# Patient Record
Sex: Male | Born: 1975 | Race: White | Hispanic: No | Marital: Married | Smoking: Never smoker
Health system: Southern US, Community
[De-identification: ages and names within clinical notes are randomized; demographics above are authoritative.]

## PROBLEM LIST (undated history)

## (undated) DIAGNOSIS — F419 Anxiety disorder, unspecified: Secondary | ICD-10-CM

## (undated) DIAGNOSIS — R160 Hepatomegaly, not elsewhere classified: Secondary | ICD-10-CM

## (undated) DIAGNOSIS — G4730 Sleep apnea, unspecified: Secondary | ICD-10-CM

## (undated) DIAGNOSIS — K5790 Diverticulosis of intestine, part unspecified, without perforation or abscess without bleeding: Secondary | ICD-10-CM

## (undated) DIAGNOSIS — R451 Restlessness and agitation: Secondary | ICD-10-CM

## (undated) HISTORY — PX: CHOLECYSTECTOMY: SHX55

## (undated) HISTORY — PX: APPENDECTOMY: SHX54

## (undated) HISTORY — PX: KNEE ARTHROSCOPY: SUR90

## (undated) HISTORY — PX: INGUINAL HERNIA REPAIR: SUR1180

## (undated) HISTORY — PX: KNEE SURGERY: SHX244

## (undated) HISTORY — PX: OTHER SURGICAL HISTORY: SHX169

---
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
## 2009-08-18 ENCOUNTER — Ambulatory Visit: Payer: Self-pay | Admitting: Internal Medicine

## 2009-09-21 ENCOUNTER — Emergency Department: Payer: Self-pay | Admitting: Emergency Medicine

## 2009-10-25 ENCOUNTER — Ambulatory Visit: Payer: Self-pay | Admitting: Internal Medicine

## 2009-10-26 ENCOUNTER — Ambulatory Visit: Payer: Self-pay | Admitting: Family Medicine

## 2010-06-01 ENCOUNTER — Ambulatory Visit: Payer: Self-pay

## 2010-06-15 ENCOUNTER — Ambulatory Visit: Payer: Self-pay

## 2011-03-03 ENCOUNTER — Ambulatory Visit: Payer: Self-pay | Admitting: Specialist

## 2011-04-25 DIAGNOSIS — K37 Unspecified appendicitis: Secondary | ICD-10-CM

## 2011-04-25 HISTORY — DX: Unspecified appendicitis: K37

## 2012-01-11 DIAGNOSIS — M2240 Chondromalacia patellae, unspecified knee: Secondary | ICD-10-CM | POA: Insufficient documentation

## 2012-01-17 ENCOUNTER — Emergency Department: Payer: Self-pay | Admitting: Emergency Medicine

## 2012-01-18 LAB — BASIC METABOLIC PANEL
Anion Gap: 8 (ref 7–16)
Calcium, Total: 9.2 mg/dL (ref 8.5–10.1)
EGFR (Non-African Amer.): >60
Glucose: 77 mg/dL (ref 65–99)
Potassium: 3.5 mmol/L (ref 3.5–5.1)
Sodium: 139 mmol/L (ref 136–145)

## 2012-01-18 LAB — TROPONIN I
Troponin-I: <0.02 ng/mL
Troponin-I: <0.02 ng/mL

## 2012-01-18 LAB — CBC
HCT: 47.3 % (ref 40.0–52.0)
MCH: 32.1 pg (ref 26.0–34.0)
MCV: 91 fL (ref 80–100)
Platelet: 188 10*3/uL (ref 150–440)
RDW: 13.1 % (ref 11.5–14.5)

## 2012-01-18 LAB — CK TOTAL AND CKMB (NOT AT ARMC)
CK, Total: 59 U/L (ref 35–232)
CK-MB: <0.5 ng/mL — ABNORMAL LOW (ref 0.5–3.6)

## 2012-04-08 LAB — URINALYSIS, COMPLETE
Bilirubin,UR: NEGATIVE
Blood: NEGATIVE
Glucose,UR: NEGATIVE mg/dL (ref 0–75)
Leukocyte Esterase: NEGATIVE
Ph: 6 (ref 4.5–8.0)
Specific Gravity: 1.015 (ref 1.003–1.030)
Squamous Epithelial: NONE SEEN

## 2012-04-08 LAB — COMPREHENSIVE METABOLIC PANEL
Alkaline Phosphatase: 92 U/L (ref 50–136)
Anion Gap: 6 — ABNORMAL LOW (ref 7–16)
BUN: 8 mg/dL (ref 7–18)
Bilirubin,Total: 0.8 mg/dL (ref 0.2–1.0)
Calcium, Total: 9 mg/dL (ref 8.5–10.1)
Chloride: 107 mmol/L (ref 98–107)
Co2: 26 mmol/L (ref 21–32)
Creatinine: 0.88 mg/dL (ref 0.60–1.30)
EGFR (African American): >60
EGFR (Non-African Amer.): >60
Glucose: 90 mg/dL (ref 65–99)
Osmolality: 275 (ref 275–301)
SGOT(AST): 31 U/L (ref 15–37)
SGPT (ALT): 57 U/L (ref 12–78)

## 2012-04-08 LAB — CBC
HGB: 16.4 g/dL (ref 13.0–18.0)
MCH: 31.3 pg (ref 26.0–34.0)
MCV: 90 fL (ref 80–100)
Platelet: 205 10*3/uL (ref 150–440)
RBC: 5.23 10*6/uL (ref 4.40–5.90)

## 2012-04-08 LAB — LIPASE, BLOOD: Lipase: 106 U/L (ref 73–393)

## 2012-04-09 ENCOUNTER — Ambulatory Visit: Payer: Self-pay | Admitting: Surgery

## 2012-04-09 HISTORY — PX: APPENDECTOMY: SHX54

## 2012-04-10 LAB — BASIC METABOLIC PANEL
Calcium, Total: 8.7 mg/dL (ref 8.5–10.1)
Co2: 26 mmol/L (ref 21–32)
Creatinine: 1 mg/dL (ref 0.60–1.30)
EGFR (Non-African Amer.): >60
Glucose: 117 mg/dL — ABNORMAL HIGH (ref 65–99)
Osmolality: 274 (ref 275–301)
Potassium: 4.1 mmol/L (ref 3.5–5.1)
Sodium: 138 mmol/L (ref 136–145)

## 2012-04-10 LAB — CBC WITH DIFFERENTIAL/PLATELET
Basophil %: 0.2 %
Eosinophil #: 0 10*3/uL (ref 0.0–0.7)
Eosinophil %: 0.1 %
HGB: 14.5 g/dL (ref 13.0–18.0)
Lymphocyte #: 1.6 10*3/uL (ref 1.0–3.6)
Lymphocyte %: 14.6 %
MCH: 31.3 pg (ref 26.0–34.0)
MCHC: 34.4 g/dL (ref 32.0–36.0)
MCV: 91 fL (ref 80–100)
Monocyte #: 1 x10 3/mm (ref 0.2–1.0)
Platelet: 211 10*3/uL (ref 150–440)
RBC: 4.65 10*6/uL (ref 4.40–5.90)

## 2012-04-10 LAB — PATHOLOGY REPORT

## 2012-04-11 LAB — CBC WITH DIFFERENTIAL/PLATELET
Basophil %: 0.7 %
Eosinophil %: 1.7 %
HCT: 41.5 % (ref 40.0–52.0)
HGB: 14 g/dL (ref 13.0–18.0)
Lymphocyte #: 2.4 10*3/uL (ref 1.0–3.6)
Lymphocyte %: 34.2 %
MCV: 91 fL (ref 80–100)
Monocyte %: 9.7 %
Neutrophil #: 3.8 10*3/uL (ref 1.4–6.5)
Platelet: 170 10*3/uL (ref 150–440)
RBC: 4.55 10*6/uL (ref 4.40–5.90)
RDW: 13.3 % (ref 11.5–14.5)
WBC: 7.1 10*3/uL (ref 3.8–10.6)

## 2012-08-01 ENCOUNTER — Inpatient Hospital Stay: Payer: Self-pay | Admitting: Internal Medicine

## 2012-08-01 LAB — COMPREHENSIVE METABOLIC PANEL
Albumin: 3.9 g/dL (ref 3.4–5.0)
Alkaline Phosphatase: 76 U/L (ref 50–136)
Anion Gap: 7 (ref 7–16)
BUN: 12 mg/dL (ref 7–18)
Bilirubin,Total: 0.8 mg/dL (ref 0.2–1.0)
Calcium, Total: 8.6 mg/dL (ref 8.5–10.1)
Chloride: 102 mmol/L (ref 98–107)
EGFR (African American): >60
EGFR (Non-African Amer.): >60
Glucose: 87 mg/dL (ref 65–99)
Total Protein: 7.9 g/dL (ref 6.4–8.2)

## 2012-08-01 LAB — CBC
HCT: 47.8 % (ref 40.0–52.0)
HGB: 16.3 g/dL (ref 13.0–18.0)
MCH: 30.5 pg (ref 26.0–34.0)
MCHC: 34.1 g/dL (ref 32.0–36.0)
Platelet: 179 10*3/uL (ref 150–440)
RBC: 5.36 10*6/uL (ref 4.40–5.90)
RDW: 13.3 % (ref 11.5–14.5)
WBC: 8.3 10*3/uL (ref 3.8–10.6)

## 2012-08-02 DIAGNOSIS — A4902 Methicillin resistant Staphylococcus aureus infection, unspecified site: Secondary | ICD-10-CM

## 2012-08-02 HISTORY — DX: Methicillin resistant Staphylococcus aureus infection, unspecified site: A49.02

## 2012-08-02 HISTORY — PX: INCISION AND DRAINAGE ABSCESS: SHX5864

## 2012-08-02 LAB — CBC WITH DIFFERENTIAL/PLATELET
Basophil #: 0 10*3/uL (ref 0.0–0.1)
Basophil %: 0.6 %
Eosinophil #: 0.1 10*3/uL (ref 0.0–0.7)
Eosinophil %: 0.8 %
Lymphocyte %: 15.6 %
MCH: 30.9 pg (ref 26.0–34.0)
MCHC: 35 g/dL (ref 32.0–36.0)
MCV: 89 fL (ref 80–100)
Monocyte #: 0.7 x10 3/mm (ref 0.2–1.0)
Monocyte %: 11.1 %
Neutrophil #: 4.8 10*3/uL (ref 1.4–6.5)
Neutrophil %: 71.9 %
Platelet: 157 10*3/uL (ref 150–440)
RBC: 4.82 10*6/uL (ref 4.40–5.90)
RDW: 13.5 % (ref 11.5–14.5)
WBC: 6.7 10*3/uL (ref 3.8–10.6)

## 2012-08-02 LAB — BASIC METABOLIC PANEL
Anion Gap: 7 (ref 7–16)
BUN: 11 mg/dL (ref 7–18)
Calcium, Total: 8.1 mg/dL — ABNORMAL LOW (ref 8.5–10.1)
Chloride: 104 mmol/L (ref 98–107)
Creatinine: 1.07 mg/dL (ref 0.60–1.30)
EGFR (Non-African Amer.): >60
Glucose: 132 mg/dL — ABNORMAL HIGH (ref 65–99)
Osmolality: 275 (ref 275–301)
Potassium: 3.5 mmol/L (ref 3.5–5.1)
Sodium: 137 mmol/L (ref 136–145)

## 2012-08-03 LAB — WOUND AEROBIC CULTURE

## 2012-08-07 LAB — CULTURE, BLOOD (SINGLE)

## 2012-08-16 DIAGNOSIS — Z8614 Personal history of Methicillin resistant Staphylococcus aureus infection: Secondary | ICD-10-CM | POA: Insufficient documentation

## 2013-02-18 DIAGNOSIS — G4733 Obstructive sleep apnea (adult) (pediatric): Secondary | ICD-10-CM | POA: Insufficient documentation

## 2013-03-24 DIAGNOSIS — S32009A Unspecified fracture of unspecified lumbar vertebra, initial encounter for closed fracture: Secondary | ICD-10-CM

## 2013-03-24 DIAGNOSIS — S2249XA Multiple fractures of ribs, unspecified side, initial encounter for closed fracture: Secondary | ICD-10-CM

## 2013-03-24 DIAGNOSIS — T1490XA Injury, unspecified, initial encounter: Secondary | ICD-10-CM

## 2013-03-24 HISTORY — DX: Injury, unspecified, initial encounter: T14.90XA

## 2013-03-24 HISTORY — DX: Multiple fractures of ribs, unspecified side, initial encounter for closed fracture: S22.49XA

## 2013-03-24 HISTORY — DX: Unspecified fracture of unspecified lumbar vertebra, initial encounter for closed fracture: S32.009A

## 2013-04-18 ENCOUNTER — Ambulatory Visit: Payer: Self-pay | Admitting: Family Medicine

## 2013-05-04 ENCOUNTER — Ambulatory Visit: Payer: Self-pay | Admitting: Internal Medicine

## 2013-05-22 DIAGNOSIS — E279 Disorder of adrenal gland, unspecified: Secondary | ICD-10-CM | POA: Insufficient documentation

## 2013-05-22 DIAGNOSIS — E278 Other specified disorders of adrenal gland: Secondary | ICD-10-CM | POA: Insufficient documentation

## 2013-09-13 ENCOUNTER — Ambulatory Visit: Payer: Self-pay | Admitting: Physician Assistant

## 2014-02-15 ENCOUNTER — Ambulatory Visit: Payer: Self-pay | Admitting: Physician Assistant

## 2014-02-15 LAB — RAPID STREP-A WITH REFLX: Micro Text Report: NEGATIVE

## 2014-02-18 LAB — BETA STREP CULTURE(ARMC)

## 2014-08-11 NOTE — Op Note (Signed)
PATIENT NAME:  Isaiah Gonzalez, Isaiah Gonzalez MR#:  [id number] DATE OF BIRTH:  [birth year]  DATE OF PROCEDURE:  04/09/2012  PREOPERATIVE DIAGNOSIS: Acute appendicitis.   POSTOPERATIVE DIAGNOSIS: Acute appendicitis.   PROCEDURE PERFORMED: Laparoscopic appendectomy.   ANESTHESIA:  General.    SURGEON: Quentin Orealph L. Ely, M.D.   OPERATIVE PROCEDURE: With the patient in the supine position after induction of appropriate general anesthesia, the patient's abdomen was prepped with ChloraPrep and draped with sterile towels. The patient was placed in the head down, feet up position. A small infraumbilical incision was made in the standard fashion, carried down bluntly through the subcutaneous tissue. The Veress needle was used to cannulate the peritoneal cavity. CO2 was insufflated to appropriate pressure measurements. When approximately 2.5 liters of CO2 were instilled, the Veress needle was withdrawn. An 11 mm [employer] port was inserted into the peritoneal cavity. Intraperitoneal position was confirmed and CO2 was reinsufflated. The patient was rotated slightly to the left side. A midepigastric transverse incision was made and an 11 mm port inserted under direct vision.  The right lower quadrant was investigated.    The appendix was thickened, edematous, clearly inflamed with the tip stuck down in the pelvis. It would not be elevated easily into the incision. A suprapubic transverse incision was made and a 12 mm port inserted under direct vision. The camera was moved to the upper port and dissection carried out through the 2 lower ports. The patient's appendix was identified and a [place] dissector used to clean the mesoappendix at the base of the appendix. Endo GIA stapler carrying a blue load was inserted through the small window and the base of the appendix was divided. The mesoappendix was then taken down with multiple applications of the Endo GIA stapler carrying a wide load.  When it was freed, there did appear to be  a localized perforation at the tip of the appendix.  The appendix was captured in an Endo Catch apparatus and removed through the suprapubic incision. The area was copiously irrigated with warm saline solution.   The lower midline fascia was closed with 0 Vicryl suture with a suture passer.  The area was infiltrated with 0.25% Marcaine for postoperative pain control. Midline fascia was closed with figure-of-eight suture of 0 Vicryl. Skin incisions were closed with 5-0 nylon. Sterile dressings were applied. The patient was returned to the recovery room, having tolerated the procedure well. Sponge, instrument, and needle counts were correct x 2 in the Operating Room.    ____________________________ Quentin Orealph L. Ely III, MD rle:cs D: 04/09/2012 07:58:21 ET T: 04/09/2012 20:05:57 ET JOB#: [id number]  cc: Quentin Orealph L. Ely III, MD, <Dictator> Quentin OreALPH L ELY MD ELECTRONICALLY SIGNED 04/17/2012 2:44

## 2014-08-11 NOTE — H&P (Signed)
PATIENT NAME:  Isaiah Gonzalez, Isaiah Gonzalez MR#:  [id number] DATE OF BIRTH:  Jul 26, 1975  DATE OF ADMISSION:  04/09/2012  CHIEF COMPLAINT: Abdominal pain.  BREIF HISTORY: The patient is a 39 year old gentleman, who was seen in the Emergency Room with a 4 to 5 day history of abdominal pain. His pain began in the periumbilical upper abdominal area and migrated to the right lower quadrant over the last several days. The pain was worse on the 13th  where he noted at work that every bump or movement increased his abdominal pain markedly. He rested over the weekend, but did not see any improvement and went to the acute care unit today for further evaluation. He was referred to the Emergency Room for a diagnostic work-up. Work-up revealed no significant laboratory abnormalities, but CT scan was performed with the patient's clinical presentation. He has a dilated swollen appendix with evidence of severe inflammation at the tip of the appendix suggesting possible rupture.   He denies any previous similar problems. He has no other GI history of note. Specifically, he denies any history of hepatitis, yellow jaundice, pancreatitis, peptic ulcer disease, gallbladder disease or diverticulitis. He does not have a history of cardiac disease, hypertension, or diabetes. He does have a history of sleep apnea and was evaluated in the Emergency Room in September for an episode of shortness of breath and hypoxia. He was seen by Dr. Meredeth IdeFleming post hospitalization, but is not on any chronic medication. He is not a cigarette smoker, has never been a cigarette smoker. Only previous surgery has been 2 knee surgeries for arthroscopy and a left inguinal hernia repair as a younger man.   MEDICATIONS: He takes no medications regularly.   ALLERGIES: He has no medical allergies.   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 He does not drink alcohol.  REVIEW OF SYSTEMS: Otherwise unremarkable with the exception of the symptoms  noted above.   PHYSICAL EXAMINATION:  VITAL SIGNS: Blood pressure 138/72, heart rate 88 and regular, and oxygen saturation is 100% percent on room air. He is afebrile.  HEENT: No scleral icterus. No facial deformities. No pupillary abnormalities.  NECK: Supple without tenderness. Trachea is midline. I cannot palpate his thyroid gland. No obvious adenopathy.  CHEST: Clear with no adventitious sounds and normal pulmonary excursion.  CARDIAC: No murmurs or gallops to my ear and seems to be in normal sinus rhythm.  ABDOMEN: Slightly distended with marked right lower quadrant point tenderness, rebound, and guarding in the right lower quadrant. He does not have any referred rebound. He has exquisite tenderness right lower quadrant. He has hypoactive, but present bowel sounds.  EXTREMITIES: Lower extremity exam reveals full range of motion, no deformities. Good distal pulses.  PSYCHIATRIC: Normal orientation, normal affect.   IMPRESSION: I have independently reviewed the CT scan. He does appear to have a lot of inflammation in the right lower quadrant with dilated edematous appendix. In this clinical presentation, I would be concerned about the possibility of rupture and will move to surgery. We will recommend moving the surgery as soon as it is convenient. This plan has been discussed with the patient in detail and he is in agreement.   ____________________________ Carmie Endalph L. Ely III, MD rle:aw D: 04/09/2012 06:08:58 ET T: 04/09/2012 08:28:55 ET JOB#: [id number]  cc: Quentin Orealph L. Ely III, MD, <Dictator> Quentin OreALPH L ELY MD ELECTRONICALLY SIGNED 04/17/2012 2:44

## 2014-08-11 NOTE — Discharge Summary (Signed)
PATIENT NAME:  [name], Baden N MR#:  [id number] DATE OF BIRTH:  02-09-1976  DATE OF ADMISSION:  04/09/2012 DATE OF DISCHARGE:  04/11/2012  BRIEF HISTORY: Isaiah Gonzalez is a 39 year old gentleman seen in the Emergency Room with a five-day history of abdominal discomfort. Workup in the Emergency Room revealed an elevated white blood cell count and low-grade fever. CT scan was performed which revealed a thickened appendix with fluid in the appendix and evidence of significant inflammatory reaction around the appendix suggesting possible rupture. The patient was taken to surgery early in the morning of 04/09/2012 where he underwent laparoscopic appendectomy. He did appear to have a contained rupture at the tip of the appendix. The procedure was accomplished laparoscopically without any significant complications. He had slow return of bowel function and slow return of pain control. This morning he is up, active and tolerating a diet with no complaints. He will be discharged home to follow in the office in 7 to 10 days' time. He has history of gastroesophageal reflux disease and requested Protonix 40 mg p.o. twice a day. He was discharged home on Norco 325/5 mg p.o. q. 4 to 6 hours p.r.n. pain.   FINAL DISCHARGE DIAGNOSIS: Acute appendicitis.   SURGERY: Laparoscopic appendectomy. ____________________________ Quentin Orealph L. Ely III, MD rle:sb D: 04/11/2012 19:13:33 ET T: 04/12/2012 09:08:05 ET JOB#: [id number]  cc: Quentin Orealph L. Ely III, MD, <Dictator> Quentin OreALPH L ELY MD ELECTRONICALLY SIGNED 04/17/2012 2:44

## 2014-08-14 NOTE — Op Note (Signed)
PATIENT NAME:  Isaiah Gonzalez, Isaiah Gonzalez MR#:  [id number] DATE OF BIRTH:  [birth year]  DATE OF PROCEDURE:  08/02/2012  SURGEON:  Cristal Deerhristopher A. Derric Dealmeida, M.D.  ASSISTANT:  Ophelia CharterMelissa Holms, PA student.  PREOPERATIVE DIAGNOSIS:  Left arm abscess.   POSTOPERATIVE DIAGNOSIS:  Left arm abscess 1 x 2 cm.   PROCEDURE PERFORMED:  Incision and drainage of left arm abscess 1 x 2 cm.   ANESTHESIA:  MAC.   ESTIMATED BLOOD LOSS:  10 mL.   COMPLICATIONS:  None.   SPECIMENS:  None.   INDICATION FOR SURGERY:  The patient is a pleasant 39 year old male with a history of approximately 3 to 4 days of left arm swelling, pain and purulence. He had previously had this abscess I and D and it is now more painful and no longer draining. I thus brought him to the Operating Room for incision and drainage of his left arm abscess.   DETAILS OF THE PROCEDURE:  Informed consent was obtained. The patient's left arm was prepped and draped in standard surgical fashion. A time-out was then performed correctly identifying patient name, operative site and procedure to be performed. An incision was made in his left arm. There was a small amount of purulence but a significant cavity. I then made an ellipse then removed an ellipse of skin to allow this to drain easily. There appeared to be some necrotic tissue in there and I initially thought it was infected sebaceous cyst but there was not a significant amount. I then irrigated the wound, obtained hemostasis. The arm was packed with 1/4-inch iodoform gauze. A sterile dressing was placed over that. He was awakened and brought to the Postanesthesia Care Unit. There were no immediate complications. Needle, sponge, and instrument counts were correct at the end of the procedure.   ____________________________ Si Raiderhristopher A. Clydia Nieves, MD cal:jm D: 08/02/2012 15:56:03 ET T: 08/03/2012 13:45:16 ET JOB#: [id number]  cc: Cristal Deerhristopher A. Chinonso Linker, MD, <Dictator> Jarvis NewcomerHRISTOPHER A Kristien Salatino  MD ELECTRONICALLY SIGNED 08/03/2012 17:13

## 2014-08-14 NOTE — Discharge Summary (Signed)
PATIENT NAME:  [name], Isaiah N MR#:  [id number] DATE OF BIRTH:  [birth year]  DATE OF ADMISSION:  08/01/2012 DATE OF DISCHARGE:  08/04/2012  DISCHARGE DIAGNOSES:  1. Left arm abscess 1 x 2 cm with methicillin-resistant Staphylococcus aureus.  2. Vomiting.   CONSULTATIONS: Dr. Juliann PulseLundquist.   PROCEDURES: Incision and drainage of the left arm abscess.   ADMITTING HISTORY, PHYSICAL AND HOSPITAL COURSE: Please see detailed H and P dictated on 08/01/2012. In brief, a 39 year old male patient who was admitted for left arm abscess. Dr. Juliann PulseLundquist of Surgery was consulted. The patient was started on clindamycin and Unasyn. The patient had an I and D on 08/02/2012 by Dr. Juliann PulseLundquist, had wound samples sent to the lab which grew MRSA sensitive to clindamycin. The patient's Unasyn was stopped, continued on clindamycin IV and changed to oral on day of discharge for one more week. The patient was given instructions on packing once a day with gauze.   On the day of discharge, the patient has some mild pain and left arm doing significantly better. Cardiovascular exam was normal and discharged home. The patient was afebrile until date of discharge.   DISCHARGE MEDICATIONS:  1. Acetaminophen 650 mg oral every 4 hours as needed for pain, temperature.  2. Clindamycin 300 mg oral 3 times a day for one week.  3. Ultracet 1 tablet oral 4 times a day as needed.  4. Zofran 4 mg oral 2 times a day as needed for nausea or vomiting.   DISCHARGE INSTRUCTIONS: The patient was discharged home on a regular diet, activity as tolerated, stay off work until 08/06/2012. The patient was given a work note prior to discharge. Follow up with primary care physician in 1 to 2 weeks and change his wound dressing/packing once a day.   Time spent on day of discharge in discharge activity was 33 minutes.    ____________________________ Molinda BailiffSrikar R. Ryn Peine, MD srs:es D: 08/05/2012 14:45:17 ET T: 08/05/2012 15:09:49  ET JOB#: [id number]  cc: Wardell HeathSrikar R. Elpidio AnisSudini, MD, <Dictator> Orie FishermanSRIKAR R Holman Bonsignore MD ELECTRONICALLY SIGNED 08/05/2012 19:16

## 2014-08-14 NOTE — H&P (Signed)
PATIENT NAME:  Isaiah Gonzalez, Isaiah Gonzalez MR#:  [id number] DATE OF BIRTH:  [birth year]  DATE OF ADMISSION:  08/01/2012  PRIMARY CARE PHYSICIAN:  [facility].   CHIEF COMPLAINT:  Left arm cellulitis.   HISTORY OF PRESENT ILLNESS:  The patient is a 39 year old male who came into urgent care yesterday for left arm pain noted to have a large area of cellulitis and abscess that was incised and drained. He was sent home on Bactrim. He was told to come back if his symptoms worsened and overnight they did worsen. It is more painful and there is more swelling and he has chills.   REVIEW OF SYSTEMS:  CONSTITUTIONAL: No fever. Positive chills and fatigue.  EYES: No blurred or double vision, glaucoma or cataracts.  ENT: No ear pain, hearing loss, seasonal allergies, postnasal drip.  RESPIRATORY: No cough, wheezing, hemoptysis, or COPD.  CARDIOVASCULAR: No chest pain, palpitations, orthopnea, edema, syncope, arrhythmia, or dyspnea on exertion.  GASTROINTESTINAL: No nausea, vomiting, diarrhea, abdominal pain, melena, or ulcers.  GENITOURINARY: No dysuria or hematuria.  ENDOCRINE: No polyuria or polydipsia or thyroid problems.  HEMATOLOGIC/LYMPHATICS: No anemia or easy bruising.  SKIN: He has an area of cellulitis on the left arm with what appears to be an abscess with erythema.  MUSCULOSKELETAL: No limited activity. No gout or swelling.  NEUROLOGIC: No CVA, TIA, or seizures.  PSYCHIATRIC: No anxiety or depression.   PAST MEDICAL HISTORY:  None.   MEDICATIONS:  None.   ALLERGIES:  No known drug allergies.   PAST SURGICAL HISTORY:   1.  Knee surgery.  2.  Appendectomy.  3.  Hernia repair.   SOCIAL HISTORY:  No tobacco, alcohol, or drug use.   FAMILY HISTORY:  No history of hypertension or diabetes.   PHYSICAL EXAMINATION:  VITAL SIGNS: Temperature 99.2, pulse 93, respirations 20, blood pressure 149/100, 97% on room air.  GENERAL: The patient is alert, oriented, not in acute distress.  HEENT: Head  is atraumatic. Pupils are round and reactive. Sclerae anicteric. Mucous membranes are moist. Oropharynx is clear.  NECK: Supple without JVD, carotid bruit or enlarged thyroid.  CARDIOVASCULAR: Regular rate and rhythm. No murmurs, gallops, or rubs. PMI is not displaced.  LUNGS: Clear to auscultation bilaterally without crackles, rales, rhonchi, or wheezing.  BACK: No CVA or vertebral tenderness.  EXTREMITIES: No clubbing, cyanosis, or edema.  NEUROLOGIC: Cranial nerves II through XII intact. There are no focal deficits.  SKIN: The patient has had cellulitis on his left forearm with what appears to be a possible abscess with some mild fluctuance. It is very tender to touch and very warm and painful.   DIAGNOSTIC DATA:  Sodium is 135, potassium 3.8, chloride 102, bicarbonate 26, BUN 12, creatinine 1.23, glucose 87, calcium 8.6, alkaline phosphatase 76, ALT 45, AST 20, total protein 7.9, albumin 3.9. White blood cells 8.3, hemoglobin 16.3, hematocrit 47.8, platelets 179.   ASSESSMENT AND PLAN:  This is a 39 year old male who is status post I and D for an abscess on his left forearm possibly from a spider bite and was sent home on Bactrim who failed outpatient therapy.  1.  Left arm cellulitis with possible abscess. The patient will be admitted to Medical/Surgical and will have surgery consult the patient. I have ordered clindamycin and Unasyn. Further management pending surgery evaluation. We will continue pain medications.  2.  Hyponatremia, mild. We will repeat a BMP in the a.m. and provide some intravenous fluids.  3.  Elevated blood pressure. We will  continue to follow. No medications at this time. This could be just pain induced.   CODE STATUS:  The patient is FULL CODE.  TIME SPENT:  Approximately 40 minutes.    ____________________________ Janyth ContesSital P. Juliene PinaMody, MD spm:si D: 08/01/2012 20:05:18 ET T: 08/01/2012 20:30:12 ET JOB#: [id number]  cc: Cleotis Sparr P. Juliene PinaMody, MD, <Dictator> [facility]  [place] Neilson Oehlert P Nazim Kadlec MD ELECTRONICALLY SIGNED 08/01/2012 21:01

## 2014-08-14 NOTE — H&P (Signed)
   Subjective/Chief Complaint Left arm pain, swelling, purulence   History of Present Illness Mr. Isaiah Gonzalez is a pleasant 39 yo M with a 3 day history of left arm anticubital swelling, erythema, and pain.  He says that it began suddenly 3 days ago.  He believes that it presented after a trauma to his arm.  He was began on bactrim and it was drained with a small incision.  It has not improved.  He has since been admitted and began on IV antibiotics.  Still with pain.  Subjective fevers.  No headache, chest pain, shortness of breath, cough, abdominal pain, nausea/vomiting, diarrhea/constipation, dysuria/hematuria.  No other history of similar abscesses.   Past History H/o appendectomy H/o knee surgery H/o hernia repair   Past Med/Surgical Hx:  Denies medical history:   Appendectomy:   knee surgery:   hernia repair:   ALLERGIES:  No Known Allergies:   Family and Social History:  Family History Non-Contributory  Negative   Social History negative tobacco, negative ETOH, negative Illicit drugs   Place of Living Home   Review of Systems:  Subjective/Chief Complaint left arm pain, redness/drainage/induration.   Fever/Chills Yes   Cough No   Sputum No   Abdominal Pain No   Diarrhea No   Constipation No   Nausea/Vomiting No   SOB/DOE No   Chest Pain No   Dysuria No   Tolerating Diet Yes   Physical Exam:  GEN well developed, well nourished, no acute distress   HEENT pink conjunctivae, PERRL, hearing intact to voice, good dentition   NECK No masses   RESP normal resp effort   CARD regular rate  no murmur  no thrills  no carotid bruits  No LE edema  no JVD  no Rub   ABD denies tenderness  denies Flank Tenderness  no liver/spleen enlargement  no hernia  soft  rigid  normal BS  no Abdominal Bruits  no Adominal Mass   LYMPH negative neck, negative axillae   EXTR + approx 1.5 x 1.5 area of induration, approx 9 x 13 area of erythema   SKIN normal to palpation, No  rashes, + small puncta with some purulence   NEURO cranial nerves intact, negative Babinski R/L, negative rigidity, negative tremor, follows commands, strength:, motor/sensory function intact   PSYCH A+O to time, place, person, good insight    Assessment/Admission Diagnosis Mr. Isaiah Gonzalez is a pleasant 39 yo M with likely left arm abscess, incompletely drained with pain/induration/erythema/purulence.   Plan To OR for I and D of left arm abscess   Electronic Signatures: Zoraida Havrilla, Si Raiderhristopher A (MD)  (Signed 11-Apr-14 10:12)  Authored: CHIEF COMPLAINT and HISTORY, PAST MEDICAL/SURGIAL HISTORY, ALLERGIES, FAMILY AND SOCIAL HISTORY, REVIEW OF SYSTEMS, PHYSICAL EXAM, ASSESSMENT AND PLAN   Last Updated: 11-Apr-14 10:12 by Jarvis NewcomerLundquist, Brinkley Peet A (MD)

## 2014-11-25 DIAGNOSIS — G8929 Other chronic pain: Secondary | ICD-10-CM | POA: Insufficient documentation

## 2015-05-02 ENCOUNTER — Ambulatory Visit (INDEPENDENT_AMBULATORY_CARE_PROVIDER_SITE_OTHER): Payer: No Typology Code available for payment source

## 2015-05-02 ENCOUNTER — Encounter: Payer: Self-pay | Admitting: *Deleted

## 2015-05-02 ENCOUNTER — Ambulatory Visit
Admission: EM | Admit: 2015-05-02 | Discharge: 2015-05-02 | Disposition: A | Discharge status: Home / Self Care | Payer: No Typology Code available for payment source | Attending: Family Medicine | Admitting: Family Medicine

## 2015-05-02 DIAGNOSIS — S92001A Unspecified fracture of right calcaneus, initial encounter for closed fracture: Secondary | ICD-10-CM

## 2015-05-02 DIAGNOSIS — S92001B Unspecified fracture of right calcaneus, initial encounter for open fracture: Secondary | ICD-10-CM | POA: Diagnosis not present

## 2015-05-02 HISTORY — DX: Sleep apnea, unspecified: G47.30

## 2015-05-02 MED ORDER — HYDROCODONE-ACETAMINOPHEN 5-325 MG PO TABS: 1.0000 | ORAL_TABLET | Freq: Three times a day (TID) | ORAL | Status: DC | PRN

## 2015-05-02 MED ORDER — KETOROLAC TROMETHAMINE 60 MG/2ML IM SOLN
60.0000 mg | Freq: Once | INTRAMUSCULAR | Status: AC
  Administered 2015-05-02: 60 mg via INTRAMUSCULAR

## 2015-05-02 NOTE — ED Provider Notes (Signed)
CSN: [id number]     Arrival date & time 05/02/15  1238 History   First MD Initiated Contact with Patient 05/02/15 1317    Nurses notes were reviewed. Chief Complaint  Patient presents with  . Ankle Injury   patient reports going to the store yesterday and said his car before the store. As he was going to his car/truck he stepped on the ice went down hurting his right ankle and right foot. States that he's had pain all during the night unable to prerenal this morning and he feels miserable this time. (Consider location/radiation/quality/duration/timing/severity/associated sxs/prior Treatment) Patient is a 40 y.o. male presenting with lower extremity injury. The history is provided by the patient. No language interpreter was used.  Ankle Injury This is a new problem. The current episode started yesterday. The problem occurs constantly. The problem has been gradually worsening. Pertinent negatives include no chest pain, no abdominal pain, no headaches and no shortness of breath. The symptoms are aggravated by walking and exertion. Nothing relieves the symptoms. He has tried a cold compress for the symptoms. The treatment provided no relief.    Past Medical History  Diagnosis Date  . Sleep apnea    Past Surgical History  Procedure Laterality Date  . Knee surgery    . Inguinal hernia repair     No family history on file. Social History  Substance Use Topics  . Smoking status: Never Smoker   . Smokeless tobacco: None  . Alcohol Use: No    Review of Systems  Respiratory: Negative for shortness of breath.   Cardiovascular: Negative for chest pain.  Gastrointestinal: Negative for abdominal pain.  Neurological: Negative for headaches.  All other systems reviewed and are negative.   Allergies  Morphine and related and Tylagesic  Home Medications   Prior to Admission medications   Medication Sig Start Date End Date Taking? Authorizing Provider  ibuprofen (ADVIL,MOTRIN) 400 MG tablet  Take 400 mg by mouth.   Yes Historical Provider, MD  HYDROcodone-acetaminophen (NORCO) 5-325 MG tablet Take 1 tablet by mouth every 8 (eight) hours as needed for moderate pain. 05/02/15   Hassan RowanEugene Archie Atilano, MD   Meds Ordered and Administered this Visit   Medications  ketorolac (TORADOL) injection 60 mg (60 mg Intramuscular Given 05/02/15 1415)    BP 129/61 mmHg  Pulse 86  Temp(Src) 98 F (36.7 C) (Oral)  Ht 6' (1.829 m)  Wt 225 lb (102.059 kg)  BMI 30.51 kg/m2  SpO2 97% No data found.   Physical Exam  Constitutional: He is oriented to person, place, and time. He appears well-developed and well-nourished.  HENT:  Head: Normocephalic and atraumatic.  Eyes: Conjunctivae are normal. Pupils are equal, round, and reactive to light.  Musculoskeletal: He exhibits tenderness.       Feet:  He is tender on both sides of the right ankle discomfort with any type of palpation or stressing of the right foot  Neurological: He is alert and oriented to person, place, and time. He has normal reflexes.  Skin: Skin is warm and dry.  Psychiatric: He has a normal mood and affect.  Vitals reviewed.   ED Course  Procedures (including critical care time)  Labs Review Labs Reviewed - No data to display  Imaging Review Dg Ankle Complete Right  05/02/2015  CLINICAL DATA:  Pt fell on the ice yesterday injuring his right ankle. Pt states it hurts to put weight on it. No previous hx of trauma or injury. Pain is medial and  lateral. EXAM: RIGHT ANKLE - COMPLETE 3+ VIEW COMPARISON:  None. FINDINGS: Mild soft tissue swelling around the ankle. Mortise intact. Distal tibia and fibula normal. Talus normal. There is evidence of irregularity involving the superior surface of the calcaneus within the subtalar joint. IMPRESSION: Possible fracture of the calcaneus. CT of the calcaneus suggested to evaluate further. Electronically Signed   By: Esperanza Heir M.D.   On: 05/02/2015 14:26     Visual Acuity Review  Right Eye  Distance:   Left Eye Distance:   Bilateral Distance:    Right Eye Near:   Left Eye Near:    Bilateral Near:         MDM   1. Fx calcaneus-open, right, initial encounter    He was administer 60 mg Toradol IM. The Toradol seemed to improve the pain. 3 further visits were made into the room first informed patient of his diagnosis of the fracture after the initial visit. Then second visit because he had listed an allergy to morphine and acetaminophen's. Turns out the acetaminophen was not a true allergy or more per request by his PCP not to take because of her fatty liver. States it wasn't from cirrhosis or from alcohol abuse he developed fatty liver. Lastly another visit to check on the OC splint that was placed for the calcaneus fracture patient instructed to follow-up with Dr. Erin Sons  And not to return to work until cleared.     Hassan Rowan, MD 05/02/15 [phone]

## 2015-05-02 NOTE — ED Notes (Signed)
Pt states that he slipped on ice yesterday, twisted right ankle, pt states that he heard a "pop'.

## 2015-05-02 NOTE — Discharge Instructions (Signed)
Calcaneal Fracture Repair  There are many different ways of treating fractures of the large irregular bone in the foot that makes up the heel of the foot (calcaneus). Calcaneal fractures can be treated with:    Immobilization--The fracture is casted as it is without changing the positions of the fracture involved.    Closed reduction--The bones are manipulated back into position without opening the site of the fracture using surgery.    Open reduction and internal fixation--The fracture site is opened and the bone pieces are fixed into place with some type of hardware (such as a screw).    Primary arthrodesis--The joint has enough damage that a procedure is done as the first treatment which will leave the joint permanently stiff. This will decrease function, however usually will leave the joint pain free.  LET YOUR HEALTH CARE PROVIDER KNOW ABOUT:   Any allergies you have.    All medicines you are taking, including vitamins, herbs, eye drops, creams, and over-the-counter medicines.    Previous problems you or members of your family have had with the use of anesthetics.   Any blood disorders you have.    Previous surgeries you have had.    Medical conditions you have.   RISKS AND COMPLICATIONS  Generally, calcaneal fracture repair is a safe procedure. However, as with any procedure, complications can occur. Possible complications include:    Swelling of the foot and ankle.   Infection of the wound or bone.   Arthritis.   Chronic pain of the foot.   Nerve injury.   Blood clot in the legs or lungs.  BEFORE THE PROCEDURE   Ask your health care provider about changing or stopping your regular medicines. You may need to stop taking certain medicines, such as aspirin or blood thinners, at least 1 week before the surgery.   X-rays and any imaging studies are reviewed with your healthcare provider. The surgeon will advise you on the best surgical approach to repair your fracture.   Do not eat or  drink anything for at least 8 hours before the surgery or as directed by your health care provider.    If you smoke, do not smoke for at least 2 weeks before the surgery.    Make plans to have someone drive you home after the procedure. Also arrange for someone to help you with activities during recovery.   PROCEDURE    You will be given medicine to help you relax (sedative). You will then be given medicine to make you sleep through the procedure (general anesthetic). These medicines will be given through an IV access tube that is put into one of your veins.    A nerve block or numbing medicine (local anesthetic) may also be used to keep you comfortable.   Once you are asleep, the foot will be cleaned and shaved if needed.   The surgeon may use a percutaneous or open technique for this surgery:    In the percutaneous approach, small cuts and pins are used to repair the fracture.    In the open technique, a cut is made along the outside of the foot and the bone pieces are placed back together with hardware. A drain may be left to collect fluid. It is removed 3-4 days after the procedure.   The surgeon then uses staples or stitches to close the incision or cuts.  AFTER THE PROCEDURE   After surgery you will be taken to the recovery area where a nurse will   pain medicine if needed.  The IV access tube will be removed before you are discharged.   This information is not intended to replace advice given to you by your health care provider. Make sure you discuss any questions you have with your health care provider.   Document Released: 01/18/2005 Document Revised: 01/29/2013 Document Reviewed: 11/12/2012 Elsevier Interactive Patient Education 2016 Elsevier Inc.  Cast or Splint Care Casts and splints support  injured limbs and keep bones from moving while they heal.  HOME CARE  Keep the cast or splint uncovered during the drying period.  A plaster cast can take 24 to 48 hours to dry.  A fiberglass cast will dry in less than 1 hour.  Do not rest the cast on anything harder than a pillow for 24 hours.  Do not put weight on your injured limb. Do not put pressure on the cast. Wait for your doctor's approval.  Keep the cast or splint dry.  Cover the cast or splint with a plastic bag during baths or wet weather.  If you have a cast over your chest and belly (trunk), take sponge baths until the cast is taken off.  If your cast gets wet, dry it with a towel or blow dryer. Use the cool setting on the blow dryer.  Keep your cast or splint clean. Wash a dirty cast with a damp cloth.  Do not put any objects under your cast or splint.  Do not scratch the skin under the cast with an object. If itching is a problem, use a blow dryer on a cool setting over the itchy area.  Do not trim or cut your cast.  Do not take out the padding from inside your cast.  Exercise your joints near the cast as told by your doctor.  Raise (elevate) your injured limb on 1 or 2 pillows for the first 1 to 3 days. GET HELP IF:  Your cast or splint cracks.  Your cast or splint is too tight or too loose.  You itch badly under the cast.  Your cast gets wet or has a soft spot.  You have a bad smell coming from the cast.  You get an object stuck under the cast.  Your skin around the cast becomes red or sore.  You have new or more pain after the cast is put on. GET HELP RIGHT AWAY IF:  You have fluid leaking through the cast.  You cannot move your fingers or toes.  Your fingers or toes turn blue or white or are cool, painful, or puffy (swollen).  You have tingling or lose feeling (numbness) around the injured area.  You have bad pain or pressure under the cast.  You have trouble breathing or have  shortness of breath.  You have chest pain.   This information is not intended to replace advice given to you by your health care provider. Make sure you discuss any questions you have with your health care provider.   Document Released: 08/10/2010 Document Revised: 12/11/2012 Document Reviewed: 10/17/2012 Elsevier Interactive Patient Education [employer] Inc.

## 2015-08-03 ENCOUNTER — Telehealth: Payer: Self-pay

## 2015-08-03 NOTE — Telephone Encounter (Signed)
Schedule change made, appointment needs moved to earlier in day. Called patient to get appointment moved. No answer. Left voicemail requesting return phone call.

## 2015-08-03 NOTE — Telephone Encounter (Signed)
Patient returned phone call and did not want to reschedule appointment. He was planning on calling and cancelling this appointment any how.

## 2015-08-04 ENCOUNTER — Ambulatory Visit: Payer: Self-pay | Admitting: Surgery

## 2015-08-14 ENCOUNTER — Encounter: Payer: Self-pay | Admitting: Emergency Medicine

## 2015-08-14 DIAGNOSIS — K5732 Diverticulitis of large intestine without perforation or abscess without bleeding: Secondary | ICD-10-CM | POA: Diagnosis not present

## 2015-08-14 DIAGNOSIS — R109 Unspecified abdominal pain: Secondary | ICD-10-CM | POA: Diagnosis present

## 2015-08-14 LAB — URINALYSIS COMPLETE WITH MICROSCOPIC (ARMC ONLY)
BILIRUBIN URINE: NEGATIVE
Bacteria, UA: NONE SEEN
GLUCOSE, UA: NEGATIVE mg/dL
HGB URINE DIPSTICK: NEGATIVE
Ketones, ur: NEGATIVE mg/dL
Nitrite: NEGATIVE
Protein, ur: NEGATIVE mg/dL
RBC / HPF: NONE SEEN RBC/hpf (ref 0–5)
SQUAMOUS EPITHELIAL / LPF: NONE SEEN
Specific Gravity, Urine: 1.013 (ref 1.005–1.030)
pH: 6 (ref 5.0–8.0)

## 2015-08-14 LAB — CBC
HCT: 45.7 % (ref 40.0–52.0)
Hemoglobin: 15.6 g/dL (ref 13.0–18.0)
MCH: 30.2 pg (ref 26.0–34.0)
MCHC: 34.1 g/dL (ref 32.0–36.0)
MCV: 88.7 fL (ref 80.0–100.0)
PLATELETS: 189 10*3/uL (ref 150–440)
RBC: 5.15 MIL/uL (ref 4.40–5.90)
RDW: 13.5 % (ref 11.5–14.5)
WBC: 10.4 10*3/uL (ref 3.8–10.6)

## 2015-08-14 LAB — COMPREHENSIVE METABOLIC PANEL
ALK PHOS: 56 U/L (ref 38–126)
ALT: 29 U/L (ref 17–63)
AST: 24 U/L (ref 15–41)
Albumin: 3.8 g/dL (ref 3.5–5.0)
Anion gap: 7 (ref 5–15)
BILIRUBIN TOTAL: 1 mg/dL (ref 0.3–1.2)
BUN: 11 mg/dL (ref 6–20)
CALCIUM: 8.9 mg/dL (ref 8.9–10.3)
CHLORIDE: 105 mmol/L (ref 101–111)
CO2: 28 mmol/L (ref 22–32)
CREATININE: 0.94 mg/dL (ref 0.61–1.24)
GFR calc Af Amer: >60 mL/min (ref >60)
Glucose, Bld: 103 mg/dL — ABNORMAL HIGH (ref 65–99)
Potassium: 3.9 mmol/L (ref 3.5–5.1)
Sodium: 140 mmol/L (ref 135–145)
TOTAL PROTEIN: 7.2 g/dL (ref 6.5–8.1)

## 2015-08-14 LAB — LIPASE, BLOOD: Lipase: 17 U/L (ref 11–51)

## 2015-08-14 NOTE — ED Notes (Signed)
Pt. States lowered centered abdominal pain that started Tuesday afternoon.  Pt. [facility] and was prescribed Cipro for possible dx of diverticulitis.  Pt. States hx of same.

## 2015-08-15 ENCOUNTER — Emergency Department
Admission: EM | Admit: 2015-08-15 | Discharge: 2015-08-15 | Disposition: A | Discharge status: Home / Self Care | Payer: BLUE CROSS/BLUE SHIELD | Attending: Emergency Medicine | Admitting: Emergency Medicine

## 2015-08-15 ENCOUNTER — Emergency Department: Payer: BLUE CROSS/BLUE SHIELD

## 2015-08-15 DIAGNOSIS — K5732 Diverticulitis of large intestine without perforation or abscess without bleeding: Secondary | ICD-10-CM

## 2015-08-15 MED ORDER — METRONIDAZOLE 500 MG PO TABS: 500.0000 mg | ORAL_TABLET | Freq: Two times a day (BID) | ORAL | Status: AC

## 2015-08-15 MED ORDER — CIPROFLOXACIN HCL 500 MG PO TABS: 500.0000 mg | ORAL_TABLET | Freq: Two times a day (BID) | ORAL | Status: AC

## 2015-08-15 MED ORDER — ONDANSETRON HCL 4 MG/2ML IJ SOLN
4.0000 mg | Freq: Once | INTRAMUSCULAR | Status: AC
  Administered 2015-08-15: 4 mg via INTRAVENOUS
  Filled 2015-08-15: qty 2

## 2015-08-15 MED ORDER — IOPAMIDOL (ISOVUE-300) INJECTION 61%
100.0000 mL | Freq: Once | INTRAVENOUS | Status: AC | PRN
  Administered 2015-08-15: 100 mL via INTRAVENOUS

## 2015-08-15 MED ORDER — DIATRIZOATE MEGLUMINE & SODIUM 66-10 % PO SOLN
15.0000 mL | Freq: Once | ORAL | Status: AC
  Administered 2015-08-15: 15 mL via ORAL

## 2015-08-15 MED ORDER — OXYCODONE-ACETAMINOPHEN 5-325 MG PO TABS: 1.0000 | ORAL_TABLET | Freq: Four times a day (QID) | ORAL | Status: DC | PRN

## 2015-08-15 MED ORDER — FENTANYL CITRATE (PF) 100 MCG/2ML IJ SOLN
100.0000 ug | Freq: Once | INTRAMUSCULAR | Status: AC
  Administered 2015-08-15: 100 ug via INTRAVENOUS
  Filled 2015-08-15: qty 2

## 2015-08-15 NOTE — Discharge Instructions (Signed)
Diverticulitis °Diverticulitis is inflammation or infection of small pouches in your colon that form when you have a condition called diverticulosis. The pouches in your colon are called diverticula. Your colon, or large intestine, is where water is absorbed and stool is formed. °Complications of diverticulitis can include: °· Bleeding. °· Severe infection. °· Severe pain. °· Perforation of your colon. °· Obstruction of your colon. °CAUSES  °Diverticulitis is caused by bacteria. °Diverticulitis happens when stool becomes trapped in diverticula. This allows bacteria to grow in the diverticula, which can lead to inflammation and infection. °RISK FACTORS °People with diverticulosis are at risk for diverticulitis. Eating a diet that does not include enough fiber from fruits and vegetables may make diverticulitis more likely to develop. °SYMPTOMS  °Symptoms of diverticulitis may include: °· Abdominal pain and tenderness. The pain is normally located on the left side of the abdomen, but may occur in other areas. °· Fever and chills. °· Bloating. °· Cramping. °· Nausea. °· Vomiting. °· Constipation. °· Diarrhea. °· Blood in your stool. °DIAGNOSIS  °Your health care provider will ask you about your medical history and do a physical exam. You may need to have tests done because many medical conditions can cause the same symptoms as diverticulitis. Tests may include: °· Blood tests. °· Urine tests. °· Imaging tests of the abdomen, including X-rays and CT scans. °When your condition is under control, your health care provider may recommend that you have a colonoscopy. A colonoscopy can show how severe your diverticula are and whether something else is causing your symptoms. °TREATMENT  °Most cases of diverticulitis are mild and can be treated at home. Treatment may include: °· Taking over-the-counter pain medicines. °· Following a clear liquid diet. °· Taking antibiotic medicines by mouth for 7-10 days. °More severe cases may  be treated at a hospital. Treatment may include: °· Not eating or drinking. °· Taking prescription pain medicine. °· Receiving antibiotic medicines through an IV tube. °· Receiving fluids and nutrition through an IV tube. °· Surgery. °HOME CARE INSTRUCTIONS  °· Follow your health care provider's instructions carefully. °· Follow a full liquid diet or other diet as directed by your health care provider. After your symptoms improve, your health care provider may tell you to change your diet. He or she may recommend you eat a high-fiber diet. Fruits and vegetables are good sources of fiber. Fiber makes it easier to pass stool. °· Take fiber supplements or probiotics as directed by your health care provider. °· Only take medicines as directed by your health care provider. °· Keep all your follow-up appointments. °SEEK MEDICAL CARE IF:  °· Your pain does not improve. °· You have a hard time eating food. °· Your bowel movements do not return to normal. °SEEK IMMEDIATE MEDICAL CARE IF:  °· Your pain becomes worse. °· Your symptoms do not get better. °· Your symptoms suddenly get worse. °· You have a fever. °· You have repeated vomiting. °· You have bloody or black, tarry stools. °MAKE SURE YOU:  °· Understand these instructions. °· Will watch your condition. °· Will get help right away if you are not doing well or get worse. °  °This information is not intended to replace advice given to you by your health care provider. Make sure you discuss any questions you have with your health care provider. °  °Document Released: 01/18/2005 Document Revised: 04/15/2013 Document Reviewed: 03/05/2013 °Elsevier Interactive Patient Education ©2016 Elsevier Inc. ° °

## 2015-08-15 NOTE — ED Notes (Signed)
Patient is stable and ambulatory.  Verbalized understanding of the discharge instructions.

## 2015-08-15 NOTE — ED Provider Notes (Signed)
[facility] Emergency Department Provider Note  ____________________________________________  Time seen: Approximately 130 AM  I have reviewed the triage vital signs and the nursing notes.   HISTORY  Chief Complaint Abdominal Pain    HPI Isaiah Gonzalez is a 40 y.o. male who comes into the hospital today with abdominal pain. The patient reports the pain started on Tuesday. [facility] and started on 2 antibiotics. He reports that he was being treated for diverticulitis as he had a similar episode one year ago. The patient was also given some tramadol for pain. He reports that his symptoms are no better. He was told that if he continued to have pain that he would need to come into the hospital for CT scan. The patient ports that this pain is an 8 out of 10 in intensity. He reports it is worse when he is moving. He's had no nausea vomiting or diarrhea and he's had a low-grade temperature at home of 99.9. The patient is here for further evaluation of his symptoms.   Past Medical History  Diagnosis Date  . Sleep apnea     There are no active problems to display for this patient.   Past Surgical History  Procedure Laterality Date  . Knee surgery    . Inguinal hernia repair    . Appendectomy    . Lt. arm surgery      Current Outpatient Rx  Name  Route  Sig  Dispense  Refill  . ciprofloxacin (CIPRO) 500 MG tablet   Oral   Take 500 mg by mouth 2 (two) times daily.         . metroNIDAZOLE (FLAGYL) 500 MG tablet   Oral   Take 500 mg by mouth 3 (three) times daily.         . traMADol (ULTRAM) 50 MG tablet   Oral   Take 50 mg by mouth every 6 (six) hours as needed.         . ciprofloxacin (CIPRO) 500 MG tablet   Oral   Take 1 tablet (500 mg total) by mouth 2 (two) times daily.   14 tablet   0   . HYDROcodone-acetaminophen (NORCO) 5-325 MG tablet   Oral   Take 1 tablet by mouth every 8 (eight) hours as  needed for moderate pain.   20 tablet   0   . ibuprofen (ADVIL,MOTRIN) 400 MG tablet   Oral   Take 400 mg by mouth.         . metroNIDAZOLE (FLAGYL) 500 MG tablet   Oral   Take 1 tablet (500 mg total) by mouth 2 (two) times daily.   14 tablet   0   . oxyCODONE-acetaminophen (ROXICET) 5-325 MG tablet   Oral   Take 1 tablet by mouth every 6 (six) hours as needed.   12 tablet   0     Allergies Morphine and related and Tylagesic  History reviewed. No pertinent family history.  Social History Social History  Substance Use Topics  . Smoking status: Never Smoker   . Smokeless tobacco: None  . Alcohol Use: No    Review of Systems Constitutional: No fever/chills Eyes: No visual changes. ENT: No sore throat. Cardiovascular: Denies chest pain. Respiratory: Denies shortness of breath. Gastrointestinal: abdominal pain.  No nausea, no vomiting.  No diarrhea.  No constipation. Genitourinary: Negative for dysuria. Musculoskeletal: Negative for back pain. Skin: Negative for rash. Neurological: Negative for headaches,  focal weakness or numbness.  10-point ROS otherwise negative.  ____________________________________________   PHYSICAL EXAM:  VITAL SIGNS: ED Triage Vitals  Enc Vitals Group     BP 08/14/15 2052 119/88 mmHg     Pulse Rate 08/14/15 2052 83     Resp 08/14/15 2052 18     Temp 08/14/15 2052 98.3 F (36.8 C)     Temp Source 08/14/15 2052 Oral     SpO2 08/14/15 2052 97 %     Weight 08/14/15 2052 225 lb 2 oz (102.116 kg)     Height 08/14/15 2052 6' (1.829 m)     Head Cir --      Peak Flow --      Pain Score 08/14/15 2103 8     Pain Loc --      Pain Edu? --      Excl. in GC? --     Constitutional: Alert and oriented. Well appearing and in Moderate distress. Eyes: Conjunctivae are normal. PERRL. EOMI. Head: Atraumatic. Nose: No congestion/rhinnorhea. Mouth/Throat: Mucous membranes are moist.  Oropharynx non-erythematous. Cardiovascular: Normal rate,  regular rhythm. Grossly normal heart sounds.  Good peripheral circulation. Respiratory: Normal respiratory effort.  No retractions. Lungs CTAB. Gastrointestinal: Soft mid lower abdominal tenderness to palpation. No distention. Positive bowel sounds Musculoskeletal: No lower extremity tenderness nor edema.   Neurologic:  Normal speech and language. Skin:  Skin is warm, dry and intact.  Psychiatric: Mood and affect are normal.   ____________________________________________   LABS (all labs ordered are listed, but only abnormal results are displayed)  Labs Reviewed  COMPREHENSIVE METABOLIC PANEL - Abnormal; Notable for the following:    Glucose, Bld 103 (*)    All other components within normal limits  URINALYSIS COMPLETEWITH MICROSCOPIC ([facility] ONLY) - Abnormal; Notable for the following:    Color, Urine YELLOW (*)    APPearance CLEAR (*)    Leukocytes, UA TRACE (*)    All other components within normal limits  LIPASE, BLOOD  CBC   ____________________________________________  EKG  None ____________________________________________  RADIOLOGY  CT abdomen and pelvis: Acute uncomplicated diverticulitis in the mid sigmoid colon rather extensive diffuse colonic diverticulosis. Hepatic steatosis. ____________________________________________   PROCEDURES  Procedure(s) performed: None  Critical Care performed: No  ____________________________________________   INITIAL IMPRESSION / ASSESSMENT AND PLAN / ED COURSE  Pertinent labs & imaging results that were available during my care of the patient were reviewed by me and considered in my medical decision making (see chart for details).  This is a 40 year old male who comes into the hospital today with some abdominal pain. I did CT scan it appears that the patient has some diverticulitis. He informed me that he only has 3 days worth of medication due to the wait was written by Dr. Orie Fisherman. I did write for the patient to have  some Percocet for pain as well as continued ciprofloxacin and Flagyl. The patient be discharged to follow back up at his memory care physician. He should return if he is unable to tolerate the medicine or if his pain is worsening while he is taking the medicine. ____________________________________________   FINAL CLINICAL IMPRESSION(S) / ED DIAGNOSES  Final diagnoses:  Diverticulitis of large intestine without perforation or abscess without bleeding      Rebecka Apley, MD 08/15/15 [phone]

## 2015-11-24 DIAGNOSIS — F419 Anxiety disorder, unspecified: Secondary | ICD-10-CM | POA: Insufficient documentation

## 2016-07-18 DIAGNOSIS — Z6832 Body mass index (BMI) 32.0-32.9, adult: Secondary | ICD-10-CM | POA: Insufficient documentation

## 2016-07-18 DIAGNOSIS — Z6835 Body mass index (BMI) 35.0-35.9, adult: Secondary | ICD-10-CM | POA: Insufficient documentation

## 2016-07-18 DIAGNOSIS — K760 Fatty (change of) liver, not elsewhere classified: Secondary | ICD-10-CM | POA: Insufficient documentation

## 2016-08-28 ENCOUNTER — Emergency Department
Admission: EM | Admit: 2016-08-28 | Discharge: 2016-08-28 | Disposition: A | Discharge status: Home / Self Care | Payer: BLUE CROSS/BLUE SHIELD | Attending: Emergency Medicine | Admitting: Emergency Medicine

## 2016-08-28 DIAGNOSIS — Y9389 Activity, other specified: Secondary | ICD-10-CM | POA: Insufficient documentation

## 2016-08-28 DIAGNOSIS — Y999 Unspecified external cause status: Secondary | ICD-10-CM | POA: Insufficient documentation

## 2016-08-28 DIAGNOSIS — T7840XA Allergy, unspecified, initial encounter: Secondary | ICD-10-CM | POA: Diagnosis present

## 2016-08-28 DIAGNOSIS — W57XXXA Bitten or stung by nonvenomous insect and other nonvenomous arthropods, initial encounter: Secondary | ICD-10-CM | POA: Insufficient documentation

## 2016-08-28 DIAGNOSIS — L500 Allergic urticaria: Secondary | ICD-10-CM | POA: Insufficient documentation

## 2016-08-28 DIAGNOSIS — Y929 Unspecified place or not applicable: Secondary | ICD-10-CM | POA: Insufficient documentation

## 2016-08-28 MED ORDER — FAMOTIDINE IN NACL 20-0.9 MG/50ML-% IV SOLN
20.0000 mg | Freq: Once | INTRAVENOUS | Status: AC
  Administered 2016-08-28: 20 mg via INTRAVENOUS
  Filled 2016-08-28: qty 50

## 2016-08-28 MED ORDER — EPINEPHRINE 0.3 MG/0.3ML IJ SOAJ: 0.3000 mg | Freq: Once | INTRAMUSCULAR | 0 refills | Status: AC

## 2016-08-28 MED ORDER — PREDNISONE 20 MG PO TABS: ORAL_TABLET | ORAL | 0 refills | Status: DC

## 2016-08-28 MED ORDER — METHYLPREDNISOLONE SODIUM SUCC 125 MG IJ SOLR
125.0000 mg | Freq: Once | INTRAMUSCULAR | Status: AC
  Administered 2016-08-28: 125 mg via INTRAVENOUS
  Filled 2016-08-28: qty 2

## 2016-08-28 MED ORDER — SODIUM CHLORIDE 0.9 % IV BOLUS (SEPSIS)
1000.0000 mL | Freq: Once | INTRAVENOUS | Status: AC
  Administered 2016-08-28: 1000 mL via INTRAVENOUS

## 2016-08-28 MED ORDER — FAMOTIDINE 20 MG PO TABS: 20.0000 mg | ORAL_TABLET | Freq: Two times a day (BID) | ORAL | 0 refills | Status: DC

## 2016-08-28 NOTE — ED Triage Notes (Signed)
Pt states that approx 2 hours ago he was moving some flower planters and was attacked by Archivistfire ants. Pt has hives and redness all over his body, pt denies diff swallowing or breathing, states that his face feels tight, pt reports taking 2 benadryl as well as spraying his body with benadryl pta

## 2016-08-28 NOTE — ED Notes (Signed)
Hives improved. Pt resting.

## 2016-08-28 NOTE — ED Notes (Signed)
Report to matt, rn for lunch relief.

## 2016-08-28 NOTE — ED Notes (Signed)
Pt with hives to bilateral forearms, upper back. Pt with hives noted to upper inner thighs. Pt with small discrete circular bite marks that are red to bilateral lower legs. Pt states his face feels tight. Slight bilateral upper eyelid swelling noted, no oral edema noted. Pt able to speak in full sentences, no resp distress noted or airway compromise noted. Pt denies vomiting or diarrhea.

## 2016-08-28 NOTE — Discharge Instructions (Signed)
1. Take the following medicines for the next 4 days: Prednisone 60mg daily Pepcid 20mg twice daily 2. Take Benadryl as needed for itching. 3. Use Epi-Pen in case of acute, life-threatening allergic reaction. 4. Return to the ER for worsening symptoms, persistent vomiting, difficulty breathing or other concerns.  

## 2016-08-28 NOTE — ED Provider Notes (Signed)
[facility] Emergency Department Provider Note   ____________________________________________   First MD Initiated Contact with Patient 08/28/16 0112     (approximate)  I have reviewed the triage vital signs and the nursing notes.   HISTORY  Chief Complaint Insect Bite    HPI Isaiah Gonzalez is a 41 y.o. male who presents to the ED from home with a chief complaint of allergic reaction. Approximately 2 hours ago patient was moving some flower planters and was attacked by Archivist. Diffuse hives and redness all over his body. He did take 50 mg of oral Benadryl prior to arrival, and his wife sprayed his body with topical Benadryl. Both state hives are starting to improve. Denies tongue swelling, difficulty breathing, chest pain, shortness of breath, abdominal pain, nausea, vomiting.    Past Medical History:  Diagnosis Date  . Sleep apnea     There are no active problems to display for this patient.   Past Surgical History:  Procedure Laterality Date  . APPENDECTOMY    . INGUINAL HERNIA REPAIR    . KNEE SURGERY    . lt. arm surgery      Prior to Admission medications   Medication Sig Start Date End Date Taking? Authorizing Provider  ciprofloxacin (CIPRO) 500 MG tablet Take 500 mg by mouth 2 (two) times daily.    [provider]  HYDROcodone-acetaminophen (NORCO) 5-325 MG tablet Take 1 tablet by mouth every 8 (eight) hours as needed for moderate pain. 05/02/15   Hassan Rowan, MD  ibuprofen (ADVIL,MOTRIN) 400 MG tablet Take 400 mg by mouth.    [provider]  metroNIDAZOLE (FLAGYL) 500 MG tablet Take 500 mg by mouth 3 (three) times daily.    [provider]  oxyCODONE-acetaminophen (ROXICET) 5-325 MG tablet Take 1 tablet by mouth every 6 (six) hours as needed. 08/15/15   Rebecka Apley, MD  traMADol (ULTRAM) 50 MG tablet Take 50 mg by mouth every 6 (six) hours as needed.    [provider]     Allergies Morphine and related and Tylagesic [acetaminophen]  No family history on file.  Social History Social History  Substance Use Topics  . Smoking status: Never Smoker  . Smokeless tobacco: Not on file  . Alcohol use No    Review of Systems  Constitutional: No fever/chills. Eyes: No visual changes. ENT: No sore throat. Cardiovascular: Denies chest pain. Respiratory: Denies shortness of breath. Gastrointestinal: No abdominal pain.  No nausea, no vomiting.  No diarrhea.  No constipation. Genitourinary: Negative for dysuria. Musculoskeletal: Negative for back pain. Skin: Positive for urticarial rash. Neurological: Negative for headaches, focal weakness or numbness.   ____________________________________________   PHYSICAL EXAM:  VITAL SIGNS: ED Triage Vitals [08/28/16 0057]  Enc Vitals Group     BP 133/81     Pulse Rate 80     Resp 16     Temp 97.9 F (36.6 C)     Temp Source Oral     SpO2 100 %     Weight 219 lb (99.3 kg)     Height 5\' 11"  (1.803 m)     Head Circumference      Peak Flow      Pain Score 5     Pain Loc      Pain Edu?      Excl. in GC?     Constitutional: Alert and oriented. Well appearing and in mild acute distress. Eyes: Conjunctivae are normal. PERRL. EOMI. Head:  Atraumatic. Nose: No congestion/rhinnorhea. Mouth/Throat: No tongue swelling or angioedema. Mucous membranes are moist.  Oropharynx non-erythematous. Neck: No stridor.  Soft submental space. No neck masses. Cardiovascular: Normal rate, regular rhythm. Grossly normal heart sounds.  Good peripheral circulation. Respiratory: Normal respiratory effort.  No retractions. Lungs CTAB. Gastrointestinal: Soft and nontender. No distention. No abdominal bruits. No CVA tenderness. Musculoskeletal: No lower extremity tenderness nor edema.  No joint effusions. Neurologic:  Normal speech and language. No gross focal neurologic deficits are appreciated. No gait instability. Skin:  Skin  is warm, dry and intact. Diffuse urticaria noted. No petechiae. Psychiatric: Mood and affect are normal. Speech and behavior are normal.  ____________________________________________   LABS (all labs ordered are listed, but only abnormal results are displayed)  Labs Reviewed - No data to display ____________________________________________  EKG  None ____________________________________________  RADIOLOGY  None ____________________________________________   PROCEDURES  Procedure(s) performed: None  Procedures  Critical Care performed: No  ____________________________________________   INITIAL IMPRESSION / ASSESSMENT AND PLAN / ED COURSE  Pertinent labs & imaging results that were available during my care of the patient were reviewed by me and considered in my medical decision making (see chart for details).  41 year old male who presents with allergic reaction secondary to fire ants bites. Took 50 mg oral Benadryl prior to arrival. Will add IV Solu-Medrol, Pepcid, IV fluids and observed in the emergency department for 2-3 hours time.  Clinical Course as of Aug 28 417  Mon Aug 28, 2016  [id number] Patient resting in no acute distress. Hives resolved. Room air saturations 97%. No facial or tongue angioedema. Strict return precautions given. Patient and spouse verbalize understanding and agree with plan of care.  [JS]    Clinical Course User Index [JS] Irean HongSung, Verdis Bassette J, MD     ____________________________________________   FINAL CLINICAL IMPRESSION(S) / ED DIAGNOSES  Final diagnoses:  Allergic reaction, initial encounter      NEW MEDICATIONS STARTED DURING THIS VISIT:  New Prescriptions   No medications on file     Note:  This document was prepared using Dragon voice recognition software and may include unintentional dictation errors.    Irean HongSung, Shaylen Nephew J, MD 08/28/16 [phone]

## 2016-08-28 NOTE — ED Notes (Signed)
Pt updated on delay. Pt verbalizes understanding.  

## 2016-08-28 NOTE — ED Notes (Signed)
Pt assisted up to commode to urinate.  

## 2016-10-24 ENCOUNTER — Emergency Department
Admission: EM | Admit: 2016-10-24 | Discharge: 2016-10-24 | Disposition: A | Discharge status: Home / Self Care | Payer: BLUE CROSS/BLUE SHIELD | Attending: Emergency Medicine | Admitting: Emergency Medicine

## 2016-10-24 ENCOUNTER — Encounter: Payer: Self-pay | Admitting: Emergency Medicine

## 2016-10-24 ENCOUNTER — Other Ambulatory Visit: Payer: Self-pay

## 2016-10-24 ENCOUNTER — Emergency Department: Payer: BLUE CROSS/BLUE SHIELD

## 2016-10-24 DIAGNOSIS — Z79899 Other long term (current) drug therapy: Secondary | ICD-10-CM | POA: Insufficient documentation

## 2016-10-24 DIAGNOSIS — R0789 Other chest pain: Secondary | ICD-10-CM | POA: Diagnosis not present

## 2016-10-24 DIAGNOSIS — R079 Chest pain, unspecified: Secondary | ICD-10-CM | POA: Diagnosis present

## 2016-10-24 HISTORY — DX: Hepatomegaly, not elsewhere classified: R16.0

## 2016-10-24 LAB — BASIC METABOLIC PANEL
ANION GAP: 6 (ref 5–15)
BUN: 14 mg/dL (ref 6–20)
CALCIUM: 8.9 mg/dL (ref 8.9–10.3)
CO2: 27 mmol/L (ref 22–32)
CREATININE: 1 mg/dL (ref 0.61–1.24)
Chloride: 107 mmol/L (ref 101–111)
GFR calc Af Amer: >60 mL/min (ref >60)
Glucose, Bld: 97 mg/dL (ref 65–99)
Potassium: 3.7 mmol/L (ref 3.5–5.1)
SODIUM: 140 mmol/L (ref 135–145)

## 2016-10-24 LAB — CBC
HCT: 48 % (ref 40.0–52.0)
Hemoglobin: 16.7 g/dL (ref 13.0–18.0)
MCH: 30.1 pg (ref 26.0–34.0)
MCHC: 34.9 g/dL (ref 32.0–36.0)
MCV: 86.3 fL (ref 80.0–100.0)
PLATELETS: 185 10*3/uL (ref 150–440)
RBC: 5.56 MIL/uL (ref 4.40–5.90)
RDW: 13.5 % (ref 11.5–14.5)
WBC: 7.5 10*3/uL (ref 3.8–10.6)

## 2016-10-24 LAB — TROPONIN I

## 2016-10-24 NOTE — ED Notes (Signed)
ED Provider at bedside. 

## 2016-10-24 NOTE — Discharge Instructions (Signed)
The tests today were normal. Please follow-up with cardiology. Dr. ENT is on call. I have included his name and contact information. Please return here for worse pain fever shortness of breath or feeling sicker. He take 1 baby aspirin a day for now.

## 2016-10-24 NOTE — ED Triage Notes (Signed)
Pt central chest pressure since Saturday.  Pain is constant and radiates to left arm and shoulder.  Strong family hx cardiac disease.  Skin warm and dry. respirations unlabored.

## 2016-10-24 NOTE — ED Provider Notes (Signed)
[facility] Emergency Department Provider Note   ____________________________________________   First MD Initiated Contact with Patient 10/24/16 1341     (approximate)  I have reviewed the triage vital signs and the nursing notes.   HISTORY  Chief Complaint Chest Pain   HPI Isaiah Gonzalez is a 41 y.o. male who reports he developed chest pressure Saturday 4 days ago. Pressures in the middle of his chest he also has some pain radiating down the left side of his neck into the shoulder and upper arm on the left side. It does not seem to change with exertion at all or rest. Seemed to get better with Protonix and aspirin. The pain is waxed and waned over the course of the last 4 days but is never gone away completely. Again he has had some episodes of fairly heavy physical work without any increase in the pain. He's had no shortness of breath. He did vomit once today. Had no other episodes of nausea vomiting. The pain is currently very mild almost gone.   Past Medical History:  Diagnosis Date  . Enlarged liver   . Sleep apnea     There are no active problems to display for this patient.   Past Surgical History:  Procedure Laterality Date  . APPENDECTOMY    . INGUINAL HERNIA REPAIR    . KNEE SURGERY    . lt. arm surgery      Prior to Admission medications   Medication Sig Start Date End Date Taking? Authorizing Provider  busPIRone (BUSPAR) 7.5 MG tablet TAKE 1 TABLET BY MOUTH TWICE A DAY 06/09/16  Yes [provider]  pantoprazole (PROTONIX) 40 MG tablet Take 40 mg by mouth daily.   Yes [provider]  famotidine (PEPCID) 20 MG tablet Take 1 tablet (20 mg total) by mouth 2 (two) times daily. Patient not taking: Reported on 10/24/2016 08/28/16   Irean Hong, MD  HYDROcodone-acetaminophen [facility]) 5-325 MG tablet Take 1 tablet by mouth every 8 (eight) hours as needed for moderate pain. Patient not taking: Reported on 10/24/2016 05/02/15    Hassan Rowan, MD  oxyCODONE-acetaminophen (ROXICET) 5-325 MG tablet Take 1 tablet by mouth every 6 (six) hours as needed. Patient not taking: Reported on 10/24/2016 08/15/15   Rebecka Apley, MD  predniSONE (DELTASONE) 20 MG tablet 3 tablets daily x 4 days Patient not taking: Reported on 10/24/2016 08/28/16   Irean Hong, MD    Allergies Morphine and related and Tylagesic [acetaminophen]  History reviewed. No pertinent family history.  Social History Social History  Substance Use Topics  . Smoking status: Never Smoker  . Smokeless tobacco: Not on file  . Alcohol use No    Review of Systems  Constitutional: No fever/chills Eyes: No visual changes. ENT: No sore throat. Cardiovascular:  chest pain. Respiratory: Denies shortness of breath. Gastrointestinal: No abdominal pain.  No nausea, no vomiting.  No diarrhea.  No constipation. Genitourinary: Negative for dysuria. Musculoskeletal: Negative for back pain. Skin: Negative for rash. Neurological: Negative for headaches, focal weakness or numbness.   ____________________________________________   PHYSICAL EXAM:  VITAL SIGNS: ED Triage Vitals  Enc Vitals Group     BP 10/24/16 1242 136/85     Pulse Rate 10/24/16 1242 91     Resp 10/24/16 1242 18     Temp 10/24/16 1242 98.8 F (37.1 C)     Temp Source 10/24/16 1242 Oral     SpO2 10/24/16 1242 98 %  Weight 10/24/16 1242 220 lb (99.8 kg)     Height 10/24/16 1242 6' (1.829 m)     Head Circumference --      Peak Flow --      Pain Score 10/24/16 1235 3     Pain Loc --      Pain Edu? --      Excl. in GC? --     Constitutional: Alert and oriented. Well appearing and in no acute distress. Eyes: Conjunctivae are normal.  Head: Atraumatic. Nose: No congestion/rhinnorhea. Mouth/Throat: Mucous membranes are moist.  Oropharynx non-erythematous. Neck: No stridor Cardiovascular: Normal rate, regular rhythm. Grossly normal heart sounds.  Good peripheral  circulation. Respiratory: Normal respiratory effort.  No retractions. Lungs CTAB. Gastrointestinal: Soft and nontender. No distention. No abdominal bruits. No CVA tenderness. Musculoskeletal: No lower extremity tenderness nor edema.  No joint effusions. Neurologic:  Normal speech and language. No gross focal neurologic deficits are appreciated.  Skin:  Skin is warm, dry and intact. No rash noted. Psychiatric: Mood and affect are normal. Speech and behavior are normal.  ____________________________________________   LABS (all labs ordered are listed, but only abnormal results are displayed)  Labs Reviewed  BASIC METABOLIC PANEL  CBC  TROPONIN I  TROPONIN I   ____________________________________________  EKG  EKG read and interpreted by me shows normal sinus rhythm rate of 83 normal axis essentially normal EKG _  EKG #2 read and interpreted by me shows normal sinus rhythm rate of 78 normal axis suspect limb leads reversal from the previous EKG  other than that there are no acute changes ___________________________________________  RADIOLOGY  Dg Chest 2 View  Result Date: 10/24/2016 CLINICAL DATA:  Chest pressure for 3 days radiating into left upper extremity EXAM: CHEST  2 VIEW COMPARISON:  Sep 13, 2013 FINDINGS: Lungs are clear. Heart size and pulmonary vascularity are normal. No adenopathy. There are old healed rib fractures on the right. No pneumothorax. IMPRESSION: No edema or consolidation. Electronically Signed   By: Bretta BangWilliam  Woodruff III M.D.   On: 10/24/2016 13:07    ____________________________________________   PROCEDURES  Procedure(s) performed:   Procedures  Critical Care performed:   ____________________________________________   INITIAL IMPRESSION / ASSESSMENT AND PLAN / ED COURSE  Pertinent labs & imaging results that were available during my care of the patient were reviewed by me and considered in my medical decision making (see chart for  details).     patient looks well on discharge we'll let him go   ____________________________________________   FINAL CLINICAL IMPRESSION(S) / ED DIAGNOSES  Final diagnoses:  Atypical chest pain      NEW MEDICATIONS STARTED DURING THIS VISIT:  Discharge Medication List as of 10/24/2016  3:32 PM       Note:  This document was prepared using Dragon voice recognition software and may include unintentional dictation errors.    Arnaldo NatalMalinda, Dangela How F, MD 10/24/16 [phone]

## 2017-11-22 IMAGING — CT CT ABD-PELV W/ CM
1 of 2 series · 15 of 32 positions shown, 19 images · IV contrast (iopamidol)
Comparison: None.

CLINICAL DATA: Midline lower abdominal pain.  Symptoms for 4 days.

EXAM:
CT ABDOMEN AND PELVIS WITH CONTRAST
TECHNIQUE: Multidetector CT imaging of the abdomen and pelvis was performed
using the standard protocol following bolus administration of
intravenous contrast.
CONTRAST:  100mL 1F2HTV-5BB IOPAMIDOL (1F2HTV-5BB) INJECTION 61%

[Series 2: routine abd pel with · axial · 0.82mm/px · z∈[-520,+15]mm · 15 of 119 slices shown, 19 images]
[im 6/119  soft-tissue]
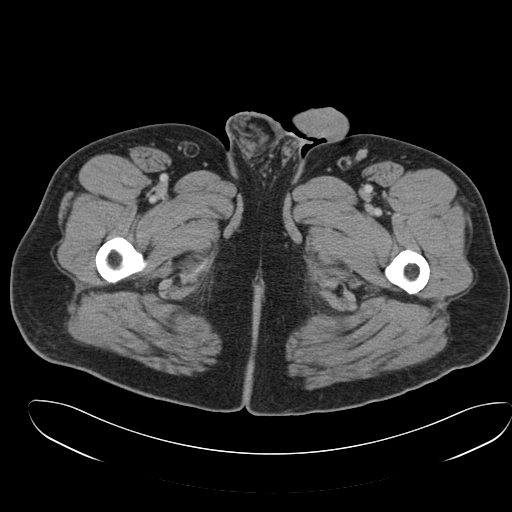
[im 6/119  bone]
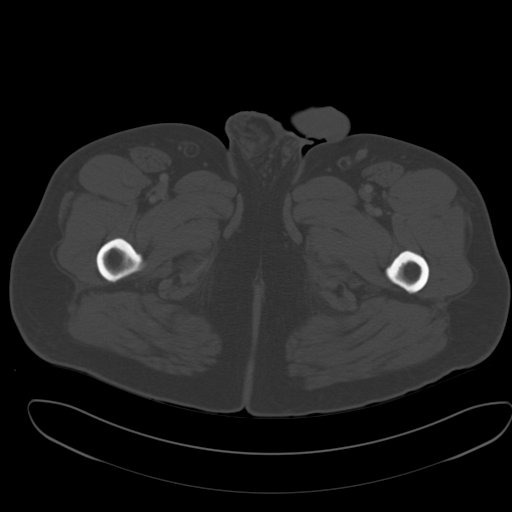
[im 16/119  soft-tissue]
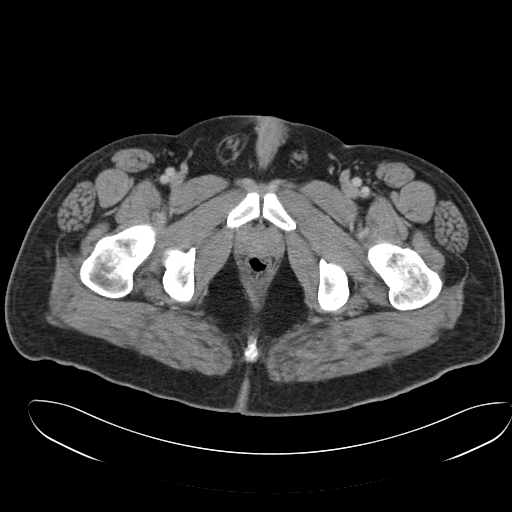
[im 26/119  soft-tissue]
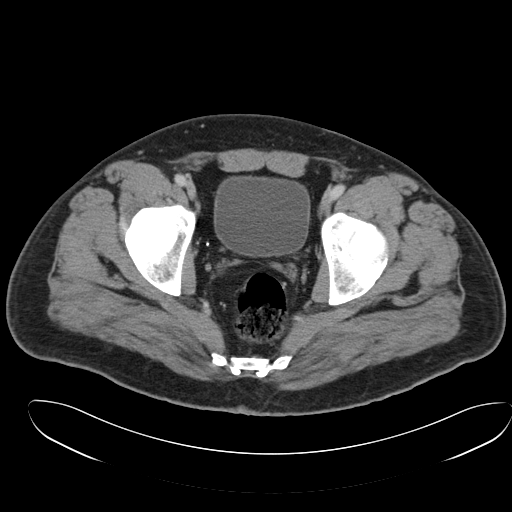
[im 31/119  soft-tissue]
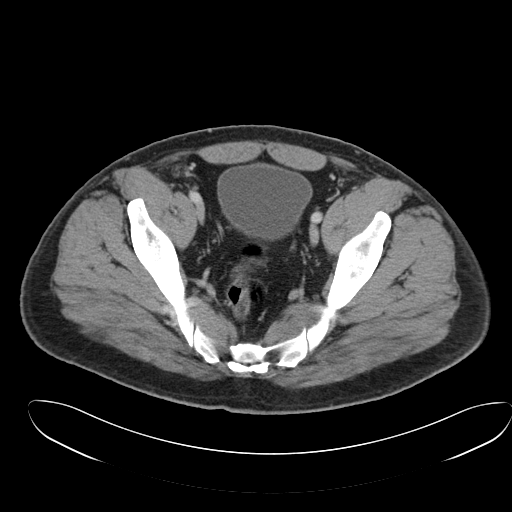
[im 42/119  soft-tissue]
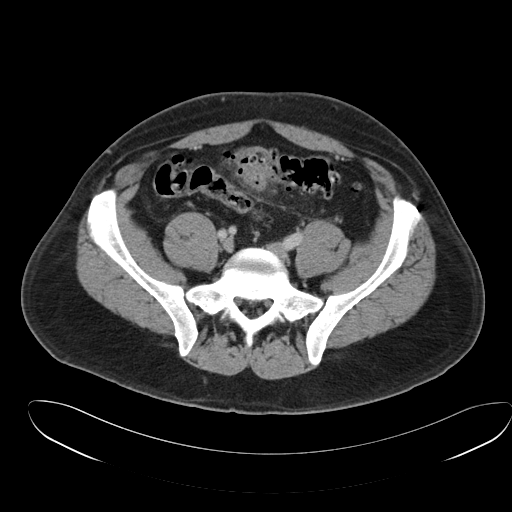
[im 52/119  soft-tissue]
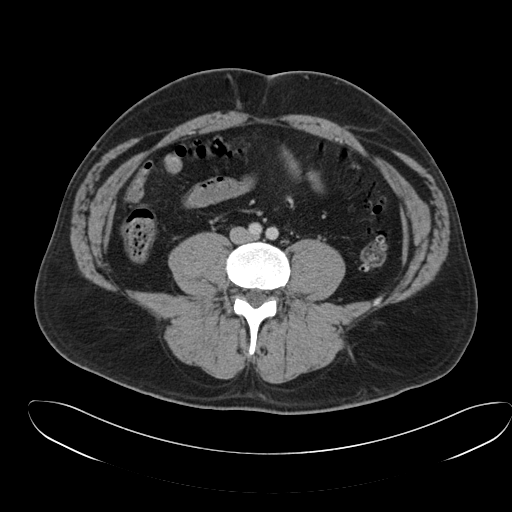
[im 62/119  soft-tissue]
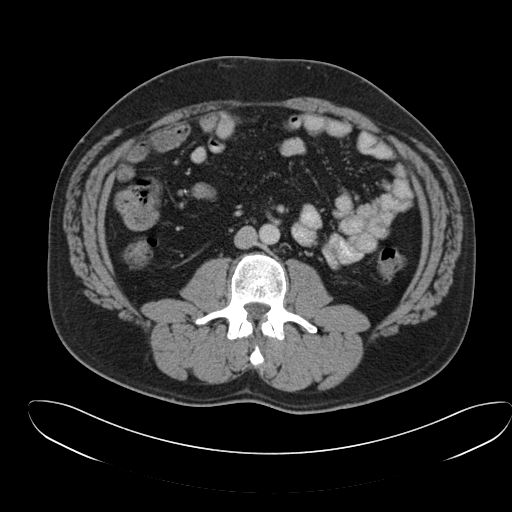
[im 67/119  soft-tissue]
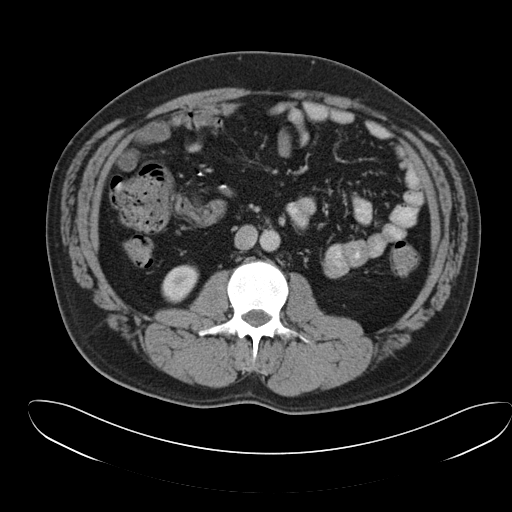
[im 77/119  soft-tissue]
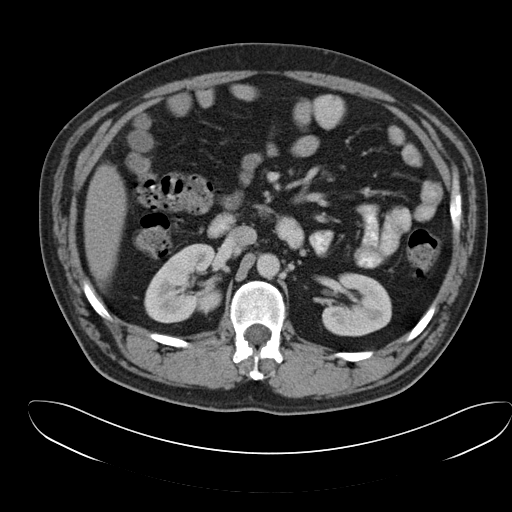
[im 77/119  bone]
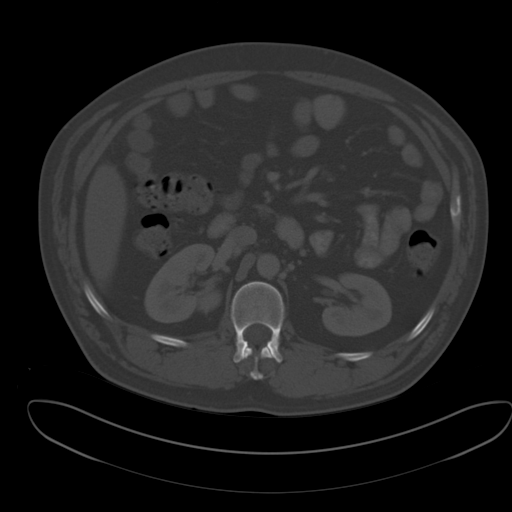
[im 88/119  soft-tissue]
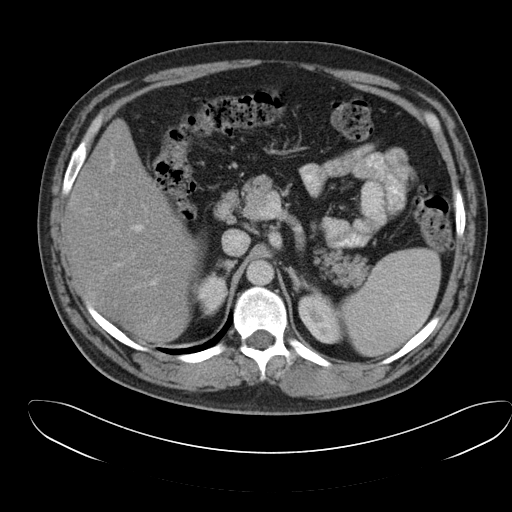
[im 93/119  soft-tissue]
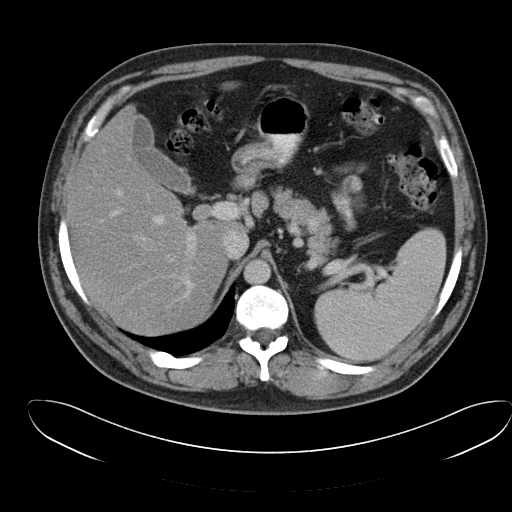
[im 98/119  lung]
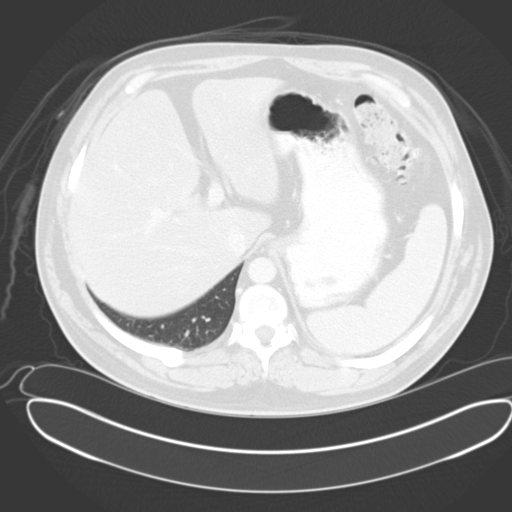
[im 103/119  soft-tissue]
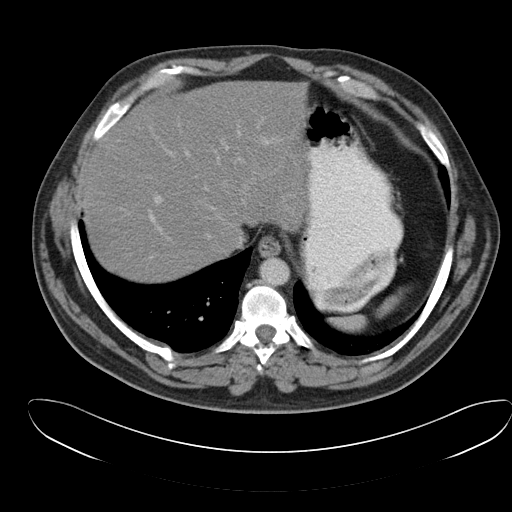
[im 103/119  lung]
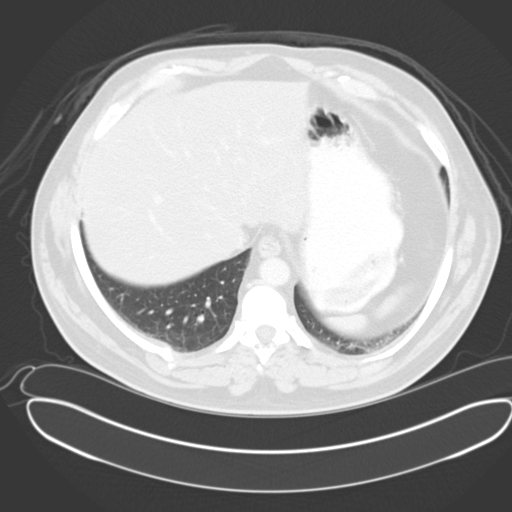
[im 108/119  lung]
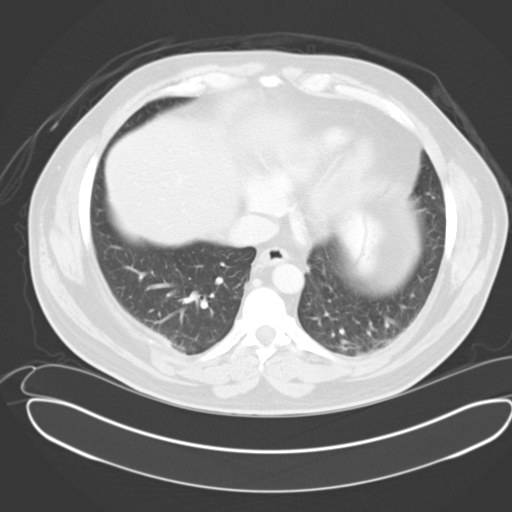
[im 113/119  soft-tissue]
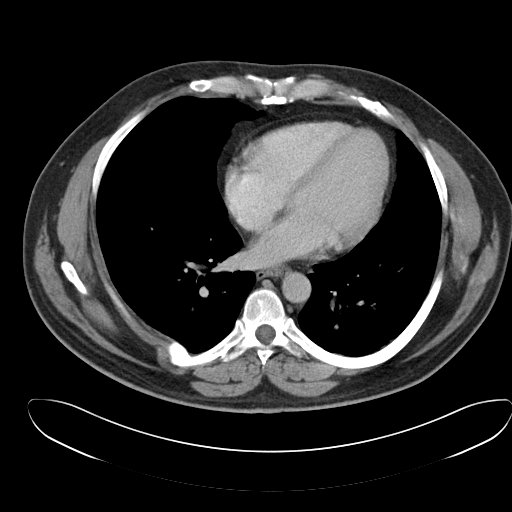
[im 113/119  lung]
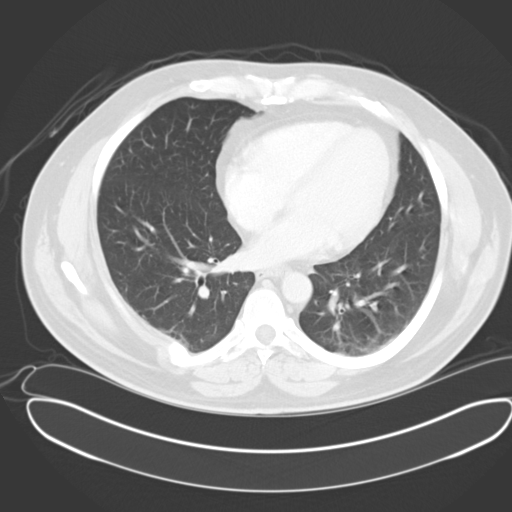

[15 of 32 positions shown; findings below may reference images not displayed]

FINDINGS: Lower chest:  Dependent atelectasis.  Remote right rib fractures.

Liver: Decreased density consistent with steatosis. No focal lesion.

Hepatobiliary: Gallbladder physiologically distended, no calcified
stone. No biliary dilatation.

Pancreas: No ductal dilatation or inflammation.

Spleen: Prominent size measuring 14.2 cm AP.

Adrenal glands: No nodule.

Kidneys: Symmetric renal enhancement.  No hydronephrosis.

Stomach/Bowel: Stomach physiologically distended. There are no
dilated or thickened small bowel loops. Diverticulosis throughout
the entire colon with evidence of acute diverticulitis in the mid
sigmoid. There is an inflamed diverticulum with adjacent pericolonic
soft tissue stranding. No perforation or abscess. Post appendectomy.

Vascular/Lymphatic: No retroperitoneal adenopathy. Abdominal aorta
is normal in caliber.

Reproductive: Normal sized prostate gland.

Bladder: Physiologically distended, no wall thickening.

Other: No free air, free fluid, or intra-abdominal fluid collection.

Musculoskeletal: There are no acute or suspicious osseous
abnormalities.
IMPRESSION: 1. Acute uncomplicated diverticulitis in the mid sigmoid colon.
Rather extensive diffuse colonic diverticulosis.
2. Hepatic steatosis.

## 2019-02-01 IMAGING — CR DG CHEST 2V
1 series · 2 of 2 positions shown · non-contrast
Comparison: September 13, 2013

CLINICAL DATA: Chest pressure for 3 days radiating into left upper
extremity

EXAM:
CHEST  2 VIEW

[Series 1: dg chest 2 view · 0.14mm/px · 2 of 2 slices shown]
[im 1/2]
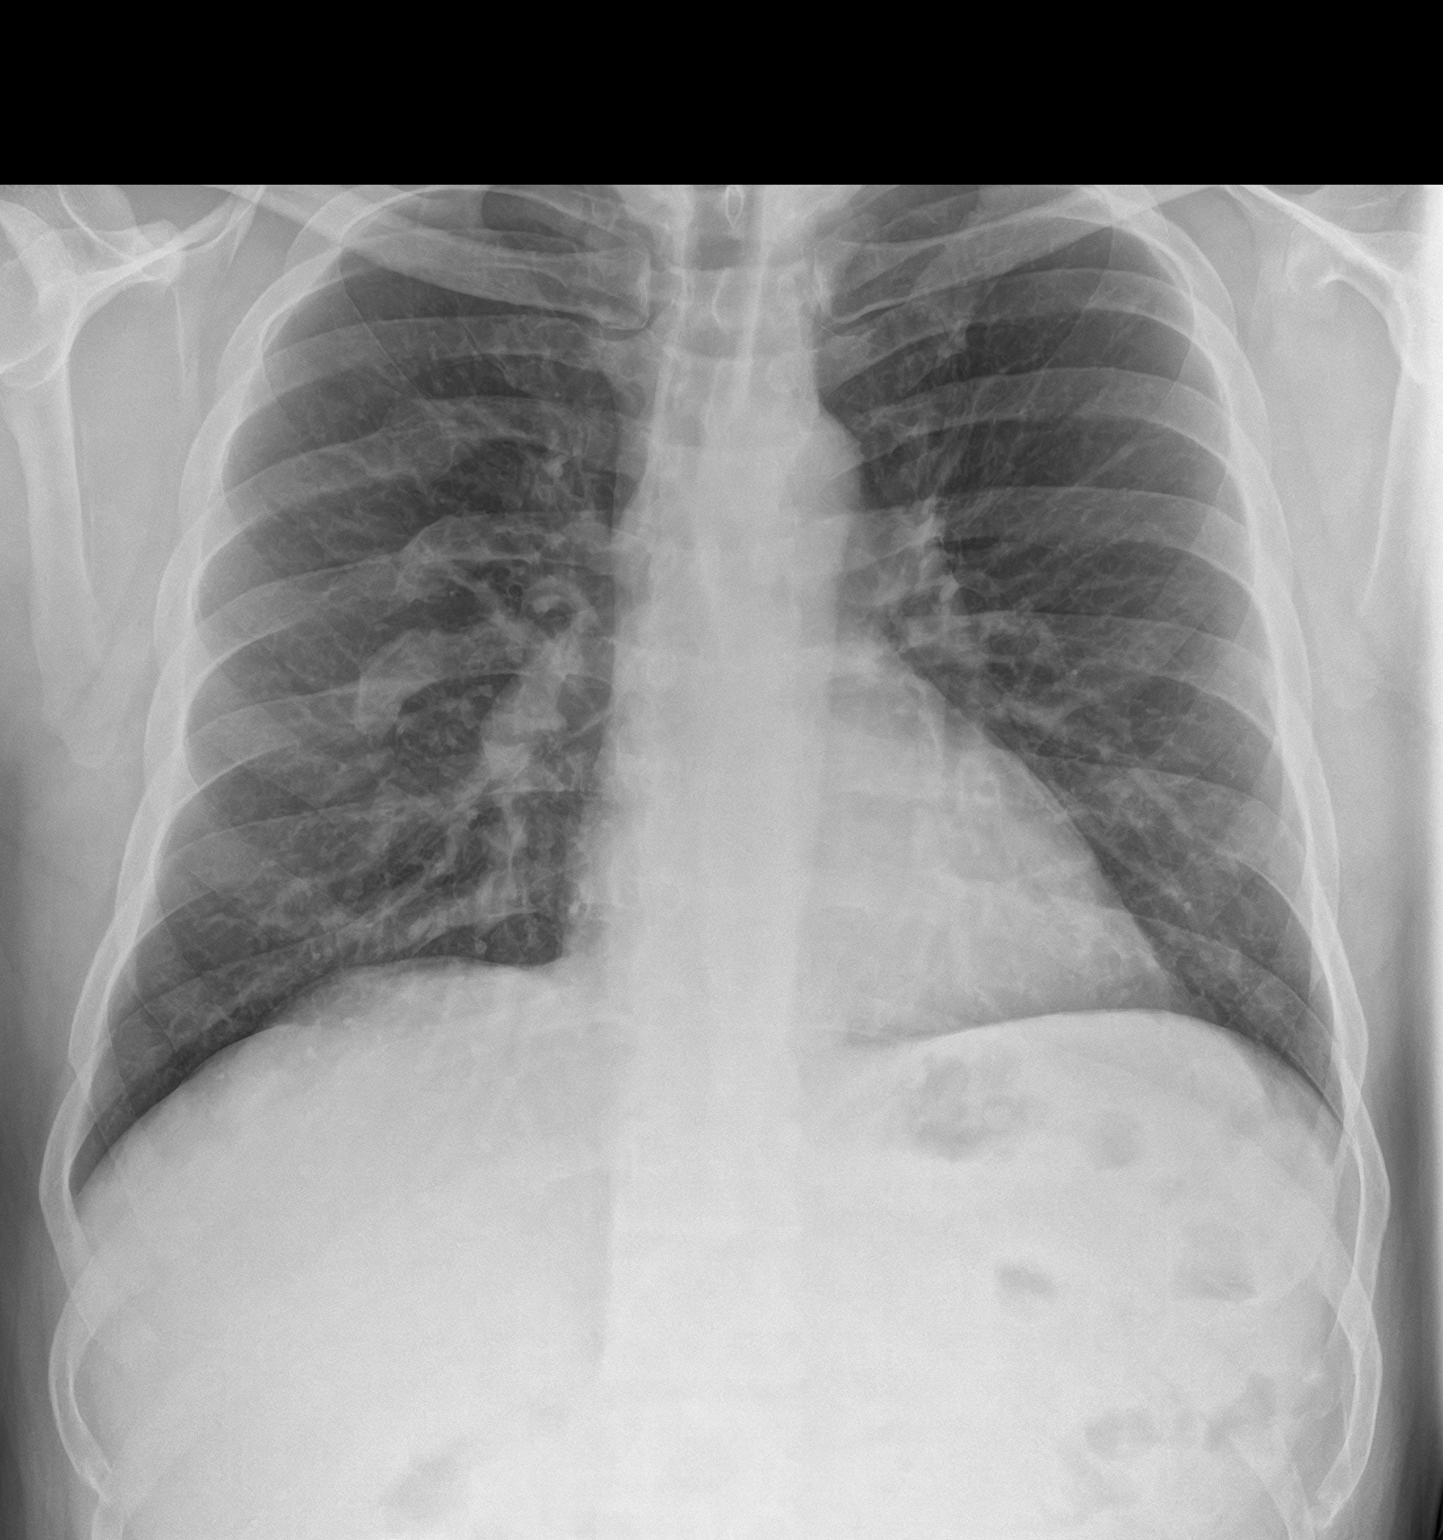
[im 2/2]
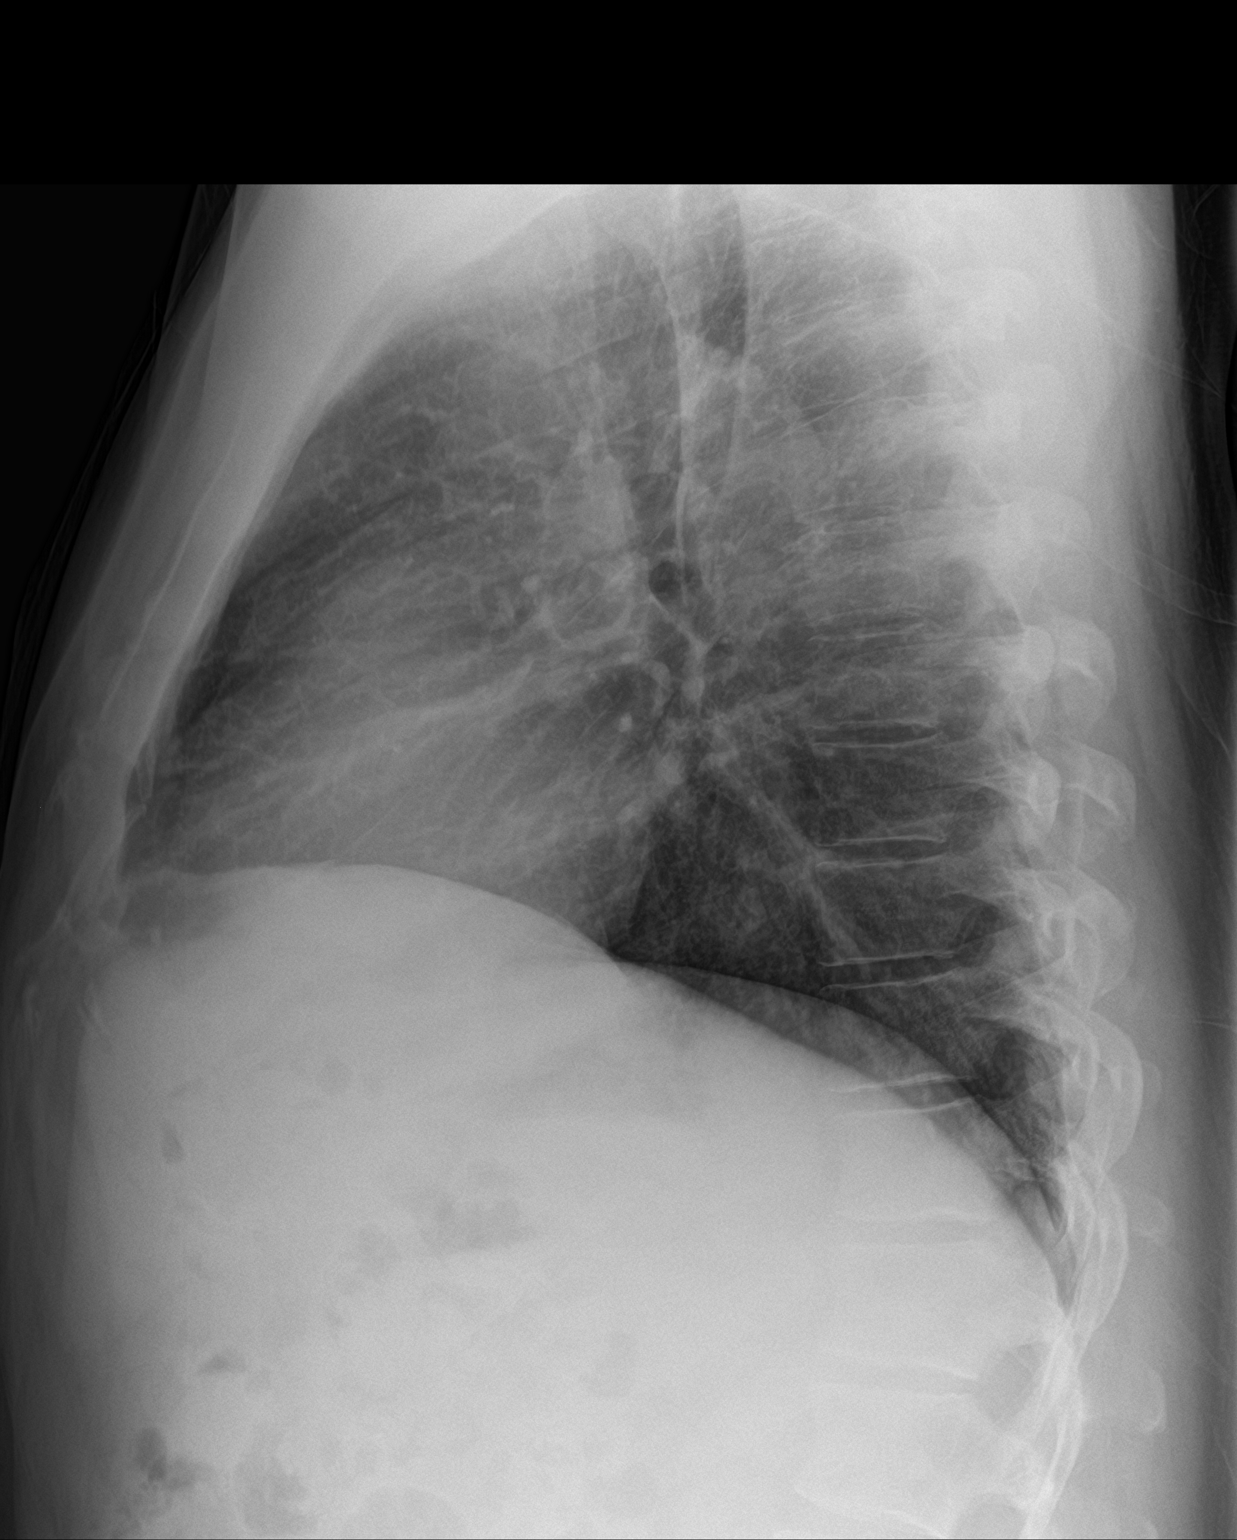

[2 of 2 positions shown; findings below may reference images not displayed]

FINDINGS: Lungs are clear. Heart size and pulmonary vascularity are normal. No
adenopathy. There are old healed rib fractures on the right. No
pneumothorax.
IMPRESSION: No edema or consolidation.

## 2021-12-06 ENCOUNTER — Ambulatory Visit (INDEPENDENT_AMBULATORY_CARE_PROVIDER_SITE_OTHER): Payer: PRIVATE HEALTH INSURANCE | Admitting: Family Medicine

## 2021-12-06 ENCOUNTER — Encounter: Payer: Self-pay | Admitting: Family Medicine

## 2021-12-06 VITALS — BP 130/82 | HR 86 | Ht 72.0 in | Wt 252.6 lb

## 2021-12-06 DIAGNOSIS — K122 Cellulitis and abscess of mouth: Secondary | ICD-10-CM | POA: Diagnosis not present

## 2021-12-06 DIAGNOSIS — K219 Gastro-esophageal reflux disease without esophagitis: Secondary | ICD-10-CM | POA: Diagnosis not present

## 2021-12-06 DIAGNOSIS — F419 Anxiety disorder, unspecified: Secondary | ICD-10-CM | POA: Diagnosis not present

## 2021-12-06 DIAGNOSIS — G4733 Obstructive sleep apnea (adult) (pediatric): Secondary | ICD-10-CM | POA: Diagnosis not present

## 2021-12-06 DIAGNOSIS — Z9989 Dependence on other enabling machines and devices: Secondary | ICD-10-CM

## 2021-12-06 MED ORDER — PANTOPRAZOLE SODIUM 40 MG PO TBEC: 40.0000 mg | DELAYED_RELEASE_TABLET | Freq: Every day | ORAL | 0 refills | Status: DC

## 2021-12-06 MED ORDER — BUPROPION HCL ER (XL) 150 MG PO TB24: ORAL_TABLET | ORAL | 3 refills | Status: DC

## 2021-12-06 MED ORDER — PAROXETINE HCL ER 12.5 MG PO TB24: 12.5000 mg | ORAL_TABLET | Freq: Every day | ORAL | 0 refills | Status: DC

## 2021-12-07 NOTE — Progress Notes (Signed)
     Primary Care / Sports Medicine Office Visit  Patient Information:  Patient ID: Isaiah Gonzalez, male DOB: November 25, 1975 Age: 46 y.o. MRN: [id number]   Isaiah Gonzalez is a pleasant 46 y.o. male presenting with the following:  Chief Complaint  Patient presents with   Establish Care    Vitals:   12/06/21 1412  BP: 130/82  Pulse: 86  SpO2: 98%   Vitals:   12/06/21 1412  Weight: 252 lb 9.6 oz (114.6 kg)  Height: 6' (1.829 m)   Body mass index is 34.26 kg/m.  No results found.   Independent interpretation of notes and tests performed by another provider:   None  Procedures performed:   None  Pertinent History, Exam, Impression, and Recommendations:   Problem List Items Addressed This Visit       Respiratory   OSA on CPAP    Chronic condition, followed by pulmonology / sleep medicine, new CPAP 4 months prior due to mask compliance issues. His recent labs showed polycythemia, BP elevated, ongoing fatigue, libido issues, and recurrent uvulitis. For the latter, referral to ENT scheduled. Otherwise monitor symptom response with new device and low threshold to consider Inspire device.        Other   Anxiety - Primary    Chronic condition with controlled symptoms though adverse effect of decreased libido since being on this medication. While he has used sildenafil to address erectile dysfunction, he is still lacking libido. Discussed transition of SSRI to alternate versus augmentation with bupropion and he has opted for latter. Will monitor response at return.      Relevant Medications   PARoxetine (PAXIL-CR) 12.5 MG 24 hr tablet   buPROPion (WELLBUTRIN XL) 150 MG 24 hr tablet   Other Visit Diagnoses     Gastroesophageal reflux disease, unspecified whether esophagitis present       Relevant Medications   pantoprazole (PROTONIX) 40 MG tablet   Uvulitis       Relevant Orders   Ambulatory referral to ENT        Orders & Medications Meds ordered this  encounter  Medications   PARoxetine (PAXIL-CR) 12.5 MG 24 hr tablet    Sig: Take 1 tablet (12.5 mg total) by mouth daily.    Dispense:  90 tablet    Refill:  0   pantoprazole (PROTONIX) 40 MG tablet    Sig: Take 1 tablet (40 mg total) by mouth daily.    Dispense:  90 tablet    Refill:  0   buPROPion (WELLBUTRIN XL) 150 MG 24 hr tablet    Sig: 1 tablet daily for a month then 1 tab twice a day    Dispense:  60 tablet    Refill:  3   Orders Placed This Encounter  Procedures   Ambulatory referral to ENT     Return in about 6 weeks (around 01/17/2022) for physical.     Jerrol Banana, MD   Primary Care Sports Medicine [facility]

## 2021-12-07 NOTE — Assessment & Plan Note (Signed)
Chronic condition, followed by pulmonology / sleep medicine, new CPAP 4 months prior due to mask compliance issues. His recent labs showed polycythemia, BP elevated, ongoing fatigue, libido issues, and recurrent uvulitis. For the latter, referral to ENT scheduled. Otherwise monitor symptom response with new device and low threshold to consider Inspire device.

## 2021-12-07 NOTE — Assessment & Plan Note (Signed)
Chronic condition with controlled symptoms though adverse effect of decreased libido since being on this medication. While he has used sildenafil to address erectile dysfunction, he is still lacking libido. Discussed transition of SSRI to alternate versus augmentation with bupropion and he has opted for latter. Will monitor response at return.

## 2021-12-07 NOTE — Patient Instructions (Signed)
-   Continue current medications - Start bupropion - Continue CPAP routinely - Return in 6 weeks for physical

## 2021-12-08 ENCOUNTER — Encounter: Payer: Self-pay | Admitting: Family Medicine

## 2021-12-14 ENCOUNTER — Encounter: Payer: Self-pay | Admitting: Family Medicine

## 2022-01-04 ENCOUNTER — Ambulatory Visit: Payer: Self-pay | Admitting: Physician Assistant

## 2022-01-06 ENCOUNTER — Other Ambulatory Visit: Payer: Self-pay | Admitting: Family Medicine

## 2022-01-06 DIAGNOSIS — F419 Anxiety disorder, unspecified: Secondary | ICD-10-CM

## 2022-01-06 NOTE — Telephone Encounter (Signed)
  Notes to clinic:  Pharm request, unsure you wanted this long of a supply.  Pharmacy comment: REQUEST FOR 90 DAYS PRESCRIPTION. DX Code Needed.     Requested Prescriptions  Pending Prescriptions Disp Refills   buPROPion (WELLBUTRIN XL) 150 MG 24 hr tablet [Pharmacy Med Name: BUPROPION HCL XL 150 MG TABLET] 180 tablet 2    Sig: 1 tablet daily for a month then 1 tab twice a day     Psychiatry: Antidepressants - bupropion Failed - 01/06/2022  8:33 AM      Failed - Cr in normal range and within 360 days    Creatinine  Date Value Ref Range Status  08/02/2012 1.07 0.60 - 1.30 mg/dL Final   Creatinine, Ser  Date Value Ref Range Status  10/24/2016 1.00 0.61 - 1.24 mg/dL Final         Failed - AST in normal range and within 360 days    AST  Date Value Ref Range Status  08/14/2015 24 15 - 41 U/L Final   SGOT(AST)  Date Value Ref Range Status  08/01/2012 20 15 - 37 Unit/L Final         Failed - ALT in normal range and within 360 days    ALT  Date Value Ref Range Status  08/14/2015 29 17 - 63 U/L Final   SGPT (ALT)  Date Value Ref Range Status  08/01/2012 45 12 - 78 U/L Final         Passed - Last BP in normal range    BP Readings from Last 1 Encounters:  12/06/21 130/82         Passed - Valid encounter within last 6 months    Recent Outpatient Visits           1 month ago Anxiety   [place] [facility] at [facility], Ocie Bob, MD

## 2022-01-30 ENCOUNTER — Other Ambulatory Visit: Payer: Self-pay | Admitting: Family Medicine

## 2022-01-30 DIAGNOSIS — F419 Anxiety disorder, unspecified: Secondary | ICD-10-CM

## 2022-01-31 NOTE — Telephone Encounter (Signed)
Requested Prescriptions  Pending Prescriptions Disp Refills  . PARoxetine (PAXIL-CR) 12.5 MG 24 hr tablet [Pharmacy Med Name: PAROXETINE ER 12.5 MG TABLET] 90 tablet 0    Sig: TAKE 1 TABLET BY MOUTH EVERY DAY     Psychiatry:  Antidepressants - SSRI Passed - 01/30/2022 12:34 PM      Passed - Valid encounter within last 6 months    Recent Outpatient Visits          1 month ago [place] [facility] [place], MD      Future Appointments            In 1 week Zigmund Daniel, Earley Abide, MD [place] at [facility], [facility]

## 2022-02-09 ENCOUNTER — Ambulatory Visit (INDEPENDENT_AMBULATORY_CARE_PROVIDER_SITE_OTHER): Payer: PRIVATE HEALTH INSURANCE | Admitting: Family Medicine

## 2022-02-09 ENCOUNTER — Encounter: Payer: Self-pay | Admitting: Family Medicine

## 2022-02-09 VITALS — BP 130/82 | HR 70 | Ht 72.0 in | Wt 248.0 lb

## 2022-02-09 DIAGNOSIS — I10 Essential (primary) hypertension: Secondary | ICD-10-CM

## 2022-02-09 DIAGNOSIS — Z13220 Encounter for screening for lipoid disorders: Secondary | ICD-10-CM

## 2022-02-09 DIAGNOSIS — K760 Fatty (change of) liver, not elsewhere classified: Secondary | ICD-10-CM | POA: Diagnosis not present

## 2022-02-09 DIAGNOSIS — Z114 Encounter for screening for human immunodeficiency virus [HIV]: Secondary | ICD-10-CM

## 2022-02-09 DIAGNOSIS — Z0000 Encounter for general adult medical examination without abnormal findings: Secondary | ICD-10-CM | POA: Diagnosis not present

## 2022-02-09 DIAGNOSIS — Z1159 Encounter for screening for other viral diseases: Secondary | ICD-10-CM

## 2022-02-09 DIAGNOSIS — Z23 Encounter for immunization: Secondary | ICD-10-CM | POA: Insufficient documentation

## 2022-02-09 DIAGNOSIS — G4733 Obstructive sleep apnea (adult) (pediatric): Secondary | ICD-10-CM

## 2022-02-09 DIAGNOSIS — R7989 Other specified abnormal findings of blood chemistry: Secondary | ICD-10-CM

## 2022-02-09 NOTE — Patient Instructions (Signed)
-   Obtain fasting labs with orders provided (can have water or black coffee but otherwise no food or drink x 8 hours before labs) - Review information provided - Attend eye doctor annually, dentist every 6 months, work towards or maintain 30 minutes of moderate intensity physical activity at least 5 days per week, and consume a balanced diet - Return in 3 months - Contact us for any questions between now and then 

## 2022-02-09 NOTE — Assessment & Plan Note (Signed)
Annual influenza vaccination administered today. 

## 2022-02-09 NOTE — Assessment & Plan Note (Signed)
Chronic, did see outside group who recommended Inspire, patient not amenable to surgical interventions. Awaiting insurance issues to see ENT / Sleep Medicine for alternate options.

## 2022-02-09 NOTE — Assessment & Plan Note (Signed)
Chronic issue, see radiographically earlier this year. Risk stratification labs ordered, referral to gastroenterology placed for further evaluation and management.

## 2022-02-09 NOTE — Progress Notes (Signed)
Annual Physical Exam Visit  Patient Information:  Patient ID: Isaiah Gonzalez, male DOB: [birth year] Age: 46 y.o. MRN: [id number]   Subjective:   CC: Annual Physical Exam  HPI:  Isaiah Gonzalez is here for their annual physical.  I reviewed the past medical history, family history, social history, surgical history, and allergies today and changes were made as necessary.  Please see the problem list section below for additional details.  Past Medical History: Past Medical History:  Diagnosis Date   Enlarged liver    Sleep apnea    Past Surgical History: Past Surgical History:  Procedure Laterality Date   APPENDECTOMY     INGUINAL HERNIA REPAIR     KNEE SURGERY     lt. arm surgery     Family History: Family History  Problem Relation Age of Onset   Heart attack Father    Heart attack Maternal Grandfather    Allergies: Allergies  Allergen Reactions   Acetaminophen Other (See Comments)    Liver complications Can't take due to having fatty liver Liver complications   Morphine And Related Itching    Pt states he not allergic Pt states he not allergic   Health Maintenance: Health Maintenance  Topic Date Due   Hepatitis C Screening  Never done   COLONOSCOPY (Pts 45-74yrs Insurance coverage will need to be confirmed)  Never done   COVID-19 Vaccine (3 - Pfizer series) 02/25/2022 (Originally 03/02/2020)   TETANUS/TDAP  02/22/2022   INFLUENZA VACCINE  Completed   HIV Screening  Completed   HPV VACCINES  Aged Out    HM Colonoscopy          Overdue - COLONOSCOPY (Pts 45-31yrs Insurance coverage will need to be confirmed) (Every 10 Years) Overdue - never done    No completion history exists for this topic.           Medications: Current Outpatient Medications on File Prior to Visit  Medication Sig Dispense Refill   B Complex Vitamins (VITAMIN B COMPLEX PO) Take by mouth.     buPROPion (WELLBUTRIN XL) 150 MG 24 hr tablet 1 TABLET DAILY FOR A MONTH  THEN 1 TAB TWICE A DAY 180 tablet 2   CALCIUM ASCORBATE PO Take by mouth.     pantoprazole (PROTONIX) 40 MG tablet Take 1 tablet (40 mg total) by mouth daily. 90 tablet 0   PARoxetine (PAXIL-CR) 12.5 MG 24 hr tablet Take 1 tablet (12.5 mg total) by mouth daily. 90 tablet 0   No current facility-administered medications on file prior to visit.    Review of Systems: No headache, visual changes, nausea, vomiting, diarrhea, constipation, dizziness, abdominal pain, skin rash, fevers, chills, night sweats, swollen lymph nodes, weight loss, chest pain, body aches, joint swelling, muscle aches, shortness of breath, mood changes, visual or auditory hallucinations reported.  Objective:   Vitals:   02/09/22 1538  BP: 130/82  Pulse: 70  SpO2: 93%   Vitals:   02/09/22 1538  Weight: 248 lb (112.5 kg)  Height: 6' (1.829 m)   Body mass index is 33.63 kg/m.  General: Well Developed, well nourished, and in no acute distress.  Neuro: Alert and oriented x3, extra-ocular muscles intact, sensation grossly intact. Cranial nerves II through XII are grossly intact, motor, sensory, and coordinative functions are intact. HEENT: Normocephalic, atraumatic, pupils equal round reactive to light, neck supple, no masses, no lymphadenopathy, thyroid nonpalpable. Oropharynx, nasopharynx, external ear canals are unremarkable. Skin: Warm and dry, no rashes  noted.  Cardiac: Regular rate and rhythm, no murmurs rubs or gallops. No peripheral edema. Pulses symmetric. Respiratory: Clear to auscultation bilaterally. Not using accessory muscles, speaking in full sentences.  Abdominal: Soft, nontender, nondistended, positive bowel sounds, no masses, lower border of liver palpable. Musculoskeletal: Shoulder, elbow, wrist, hip, knee, ankle stable, and with full range of motion.   Impression and Recommendations:   The patient was counselled, risk factors were discussed, and anticipatory guidance given.  Problem List Items  Addressed This Visit       Respiratory   OSA on CPAP    Chronic, did see outside group who recommended Inspire, patient not amenable to surgical interventions. Awaiting insurance issues to see ENT / Sleep Medicine for alternate options.        Digestive   Fatty liver    Chronic issue, see radiographically earlier this year. Risk stratification labs ordered, referral to gastroenterology placed for further evaluation and management.      Relevant Orders   CBC   Comprehensive metabolic panel   Hepatitis C antibody   Lipid panel   Ambulatory referral to Gastroenterology     Other   Annual physical exam - Primary    Annual examination completed, risk stratification labs ordered, anticipatory guidance provided.  We will follow labs once resulted.      Relevant Orders   CBC   Comprehensive metabolic panel   Hepatitis C antibody   HIV Antibody (routine testing w rflx)   Lipid panel   TSH   VITAMIN D 25 Hydroxy (Vit-D Deficiency, Fractures)   Need for immunization against influenza    Annual influenza vaccination administered today.      Relevant Orders   Flu Vaccine QUAD 28mo+IM (Fluarix, Fluzone & Alfiuria Quad PF) (Completed)   Other Visit Diagnoses     Hypertension, unspecified type       Need for hepatitis C screening test       Relevant Orders   Hepatitis C antibody   Screening for HIV (human immunodeficiency virus)       Relevant Orders   HIV Antibody (routine testing w rflx)   Screening for lipoid disorders       Relevant Orders   Comprehensive metabolic panel   Lipid panel   Low serum vitamin D       Relevant Orders   VITAMIN D 25 Hydroxy (Vit-D Deficiency, Fractures)        Orders & Medications Medications: No orders of the defined types were placed in this encounter.  Orders Placed This Encounter  Procedures   Flu Vaccine QUAD 64mo+IM (Fluarix, Fluzone & Alfiuria Quad PF)   CBC   Comprehensive metabolic panel   Hepatitis C antibody   HIV Antibody  (routine testing w rflx)   Lipid panel   TSH   VITAMIN D 25 Hydroxy (Vit-D Deficiency, Fractures)   Ambulatory referral to Gastroenterology     Return in about 3 months (around 05/12/2022).    Montel Culver, MD   Primary Care Sports Medicine [place]

## 2022-02-09 NOTE — Assessment & Plan Note (Signed)
Annual examination completed, risk stratification labs ordered, anticipatory guidance provided.  We will follow labs once resulted. 

## 2022-02-11 LAB — CBC
Hematocrit: 50.8 % (ref 37.5–51.0)
Hemoglobin: 17.2 g/dL (ref 13.0–17.7)
MCH: 30.4 pg (ref 26.6–33.0)
MCHC: 33.9 g/dL (ref 31.5–35.7)
MCV: 90 fL (ref 79–97)
Platelets: 214 10*3/uL (ref 150–450)
RBC: 5.66 x10E6/uL (ref 4.14–5.80)
RDW: 12.5 % (ref 11.6–15.4)
WBC: 7.2 10*3/uL (ref 3.4–10.8)

## 2022-02-11 LAB — COMPREHENSIVE METABOLIC PANEL
ALT: 48 IU/L — ABNORMAL HIGH (ref 0–44)
AST: 36 IU/L (ref 0–40)
Albumin/Globulin Ratio: 1.8 (ref 1.2–2.2)
Albumin: 4.4 g/dL (ref 4.1–5.1)
Alkaline Phosphatase: 73 IU/L (ref 44–121)
BUN/Creatinine Ratio: 8 — ABNORMAL LOW (ref 9–20)
BUN: 8 mg/dL (ref 6–24)
Bilirubin Total: 0.6 mg/dL (ref 0.0–1.2)
CO2: 22 mmol/L (ref 20–29)
Calcium: 9 mg/dL (ref 8.7–10.2)
Chloride: 103 mmol/L (ref 96–106)
Creatinine, Ser: 1 mg/dL (ref 0.76–1.27)
Globulin, Total: 2.5 g/dL (ref 1.5–4.5)
Glucose: 90 mg/dL (ref 70–99)
Potassium: 4 mmol/L (ref 3.5–5.2)
Sodium: 141 mmol/L (ref 134–144)
Total Protein: 6.9 g/dL (ref 6.0–8.5)
eGFR: 95 mL/min/{1.73_m2} (ref >59)

## 2022-02-11 LAB — HIV ANTIBODY (ROUTINE TESTING W REFLEX): HIV Screen 4th Generation wRfx: NONREACTIVE

## 2022-02-11 LAB — LIPID PANEL
Chol/HDL Ratio: 4.5 ratio (ref 0.0–5.0)
Cholesterol, Total: 143 mg/dL (ref 100–199)
HDL: 32 mg/dL — ABNORMAL LOW (ref >39)
LDL Chol Calc (NIH): 91 mg/dL (ref 0–99)
Triglycerides: 105 mg/dL (ref 0–149)
VLDL Cholesterol Cal: 20 mg/dL (ref 5–40)

## 2022-02-11 LAB — VITAMIN D 25 HYDROXY (VIT D DEFICIENCY, FRACTURES): Vit D, 25-Hydroxy: 37.7 ng/mL (ref 30.0–100.0)

## 2022-02-11 LAB — TSH: TSH: 1.5 u[IU]/mL (ref 0.450–4.500)

## 2022-02-11 LAB — HEPATITIS C ANTIBODY: Hep C Virus Ab: NONREACTIVE

## 2022-03-08 ENCOUNTER — Encounter: Payer: Self-pay | Admitting: Family Medicine

## 2022-03-08 NOTE — Telephone Encounter (Signed)
Please review.  KP

## 2022-03-12 ENCOUNTER — Other Ambulatory Visit: Payer: Self-pay | Admitting: Family Medicine

## 2022-03-12 DIAGNOSIS — K219 Gastro-esophageal reflux disease without esophagitis: Secondary | ICD-10-CM

## 2022-03-13 NOTE — Telephone Encounter (Signed)
Requested Prescriptions  Pending Prescriptions Disp Refills   pantoprazole (PROTONIX) 40 MG tablet [Pharmacy Med Name: PANTOPRAZOLE SOD DR 40 MG TAB] 90 tablet 0    Sig: TAKE 1 TABLET BY MOUTH EVERY DAY     Gastroenterology: Proton Pump Inhibitors Passed - 03/12/2022  1:17 AM      Passed - Valid encounter within last 12 months    Recent Outpatient Visits           1 month ago Annual physical exam   [place] [facility] at [facility], Ocie Bob, MD   3 months ago Anxiety   [facility] at [facility], Ocie Bob, MD       Future Appointments             In 2 months Ashley Royalty, Ocie Bob, MD [facility] and [facility] Medicine at [facility], [facility]

## 2022-04-02 ENCOUNTER — Other Ambulatory Visit: Payer: Self-pay | Admitting: Family Medicine

## 2022-04-02 DIAGNOSIS — F419 Anxiety disorder, unspecified: Secondary | ICD-10-CM

## 2022-04-04 NOTE — Telephone Encounter (Signed)
Requested Prescriptions  Pending Prescriptions Disp Refills   PARoxetine (PAXIL-CR) 12.5 MG 24 hr tablet [Pharmacy Med Name: PAROXETINE ER 12.5 MG TABLET] 90 tablet 0    Sig: TAKE 1 TABLET BY MOUTH EVERY DAY     Psychiatry:  Antidepressants - SSRI Passed - 04/02/2022  8:50 PM      Passed - Valid encounter within last 6 months    Recent Outpatient Visits           1 month ago Annual physical exam   [place] [facility] at [facility], Ocie Bob, MD   3 months ago Anxiety   [facility] at [facility], Ocie Bob, MD       Future Appointments             In 1 month Ashley Royalty, Ocie Bob, MD [facility] Primary Care and Sports Medicine at [facility], [facility]

## 2022-04-19 ENCOUNTER — Other Ambulatory Visit: Payer: Self-pay | Admitting: Family Medicine

## 2022-04-19 DIAGNOSIS — F419 Anxiety disorder, unspecified: Secondary | ICD-10-CM

## 2022-04-21 NOTE — Telephone Encounter (Signed)
Last reordered 01/09/22 #180 2 RF  Requested Prescriptions  Refused Prescriptions Disp Refills   buPROPion (WELLBUTRIN XL) 150 MG 24 hr tablet [Pharmacy Med Name: BUPROPION HCL XL 150 MG TABLET] 180 tablet 3    Sig: TAKE 1 TABLET DAILY FOR A MONTH THEN 1 TAB TWICE A DAY     Psychiatry: Antidepressants - bupropion Failed - 04/19/2022 10:33 AM      Failed - ALT in normal range and within 360 days    ALT  Date Value Ref Range Status  02/10/2022 48 (H) 0 - 44 IU/L Final   SGPT (ALT)  Date Value Ref Range Status  08/01/2012 45 12 - 78 U/L Final         Passed - Cr in normal range and within 360 days    Creatinine  Date Value Ref Range Status  08/02/2012 1.07 0.60 - 1.30 mg/dL Final   Creatinine, Ser  Date Value Ref Range Status  02/10/2022 1.00 0.76 - 1.27 mg/dL Final         Passed - AST in normal range and within 360 days    AST  Date Value Ref Range Status  02/10/2022 36 0 - 40 IU/L Final   SGOT(AST)  Date Value Ref Range Status  08/01/2012 20 15 - 37 Unit/L Final         Passed - Last BP in normal range    BP Readings from Last 1 Encounters:  02/09/22 130/82         Passed - Valid encounter within last 6 months    Recent Outpatient Visits           2 months ago Annual physical exam   [place] [facility] at [facility], Ocie Bob, MD   4 months ago Anxiety   [facility] Primary Care and Sports Medicine at [facility], Ocie Bob, MD       Future Appointments             In 3 weeks Ashley Royalty, Ocie Bob, MD [facility] Primary Care and Sports Medicine at [facility], [facility]

## 2022-04-23 DIAGNOSIS — J0110 Acute frontal sinusitis, unspecified: Secondary | ICD-10-CM | POA: Diagnosis not present

## 2022-04-23 DIAGNOSIS — R8281 Pyuria: Secondary | ICD-10-CM | POA: Diagnosis not present

## 2022-04-23 DIAGNOSIS — R0981 Nasal congestion: Secondary | ICD-10-CM | POA: Diagnosis not present

## 2022-05-18 ENCOUNTER — Ambulatory Visit: Payer: PRIVATE HEALTH INSURANCE | Admitting: Family Medicine

## 2022-05-30 ENCOUNTER — Other Ambulatory Visit: Payer: Self-pay | Admitting: Family Medicine

## 2022-05-30 DIAGNOSIS — F419 Anxiety disorder, unspecified: Secondary | ICD-10-CM

## 2022-05-31 ENCOUNTER — Ambulatory Visit (INDEPENDENT_AMBULATORY_CARE_PROVIDER_SITE_OTHER): Payer: 59 | Admitting: Family Medicine

## 2022-05-31 ENCOUNTER — Encounter: Payer: Self-pay | Admitting: Family Medicine

## 2022-05-31 VITALS — BP 108/72 | HR 72 | Ht 72.0 in | Wt 248.0 lb

## 2022-05-31 DIAGNOSIS — K760 Fatty (change of) liver, not elsewhere classified: Secondary | ICD-10-CM

## 2022-05-31 DIAGNOSIS — M546 Pain in thoracic spine: Secondary | ICD-10-CM

## 2022-05-31 DIAGNOSIS — R69 Illness, unspecified: Secondary | ICD-10-CM | POA: Diagnosis not present

## 2022-05-31 DIAGNOSIS — G4733 Obstructive sleep apnea (adult) (pediatric): Secondary | ICD-10-CM | POA: Diagnosis not present

## 2022-05-31 DIAGNOSIS — F419 Anxiety disorder, unspecified: Secondary | ICD-10-CM

## 2022-05-31 DIAGNOSIS — M545 Low back pain, unspecified: Secondary | ICD-10-CM | POA: Diagnosis not present

## 2022-05-31 DIAGNOSIS — K219 Gastro-esophageal reflux disease without esophagitis: Secondary | ICD-10-CM

## 2022-05-31 MED ORDER — PANTOPRAZOLE SODIUM 40 MG PO TBEC: 40.0000 mg | DELAYED_RELEASE_TABLET | Freq: Every day | ORAL | 2 refills | Status: DC

## 2022-05-31 MED ORDER — PAROXETINE HCL ER 12.5 MG PO TB24: 12.5000 mg | ORAL_TABLET | Freq: Every day | ORAL | 2 refills | Status: DC

## 2022-05-31 MED ORDER — LIDOCAINE 5 % EX PTCH: 1.0000 | MEDICATED_PATCH | Freq: Two times a day (BID) | CUTANEOUS | 2 refills | Status: DC

## 2022-05-31 MED ORDER — BUPROPION HCL ER (SR) 150 MG PO TB12: 150.0000 mg | ORAL_TABLET | Freq: Two times a day (BID) | ORAL | 2 refills | Status: DC

## 2022-05-31 NOTE — Assessment & Plan Note (Signed)
Improved compliance reported, did touch base with pulmonologist and is not amenable to pursuing inspire device.  Encouraged continued usage of CPAP on a regular basis.

## 2022-05-31 NOTE — Assessment & Plan Note (Signed)
Mildly elevated ALT on recent labs, given stated concern referral to GI has been placed for further evaluation, he has this visit upcoming.  Will follow peripherally on this and have encouraged lifestyle modifications over the interim.
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
## 2022-05-31 NOTE — Telephone Encounter (Signed)
Requested medication (s) are due for refill today - no  Requested medication (s) are on the active medication list -yes  Future visit scheduled -yes-today  Last refill: 04/04/22 #90  Notes to clinic: Patient has appointment today- RF request sent for review- too soon per protocol  Requested Prescriptions  Pending Prescriptions Disp Refills   PARoxetine (PAXIL-CR) 12.5 MG 24 hr tablet [Pharmacy Med Name: PAROXETINE ER 12.5 MG TABLET] 90 tablet 1    Sig: TAKE 1 [place]     Psychiatry:  Antidepressants - SSRI Passed - 05/30/2022  9:32 AM      Passed - Valid encounter within last 6 months    Recent Outpatient Visits           Today [place] at [place], Earley Abide, MD   3 months ago Annual physical exam   [place] at [facility], Earley Abide, MD   5 months ago [place] at [facility], Earley Abide, MD              Refused Prescriptions Disp Refills   buPROPion (WELLBUTRIN XL) 150 MG 24 hr tablet [Pharmacy Med Name: BUPROPION HCL XL 150 MG TABLET] 180 tablet 3    Sig: TAKE 1 TABLET DAILY FOR A MONTH THEN 1 TAB TWICE A DAY     Psychiatry: Antidepressants - bupropion Failed - 05/30/2022  9:32 AM      Failed - ALT in normal range and within 360 days    ALT  Date Value Ref Range Status  02/10/2022 48 (H) 0 - 44 IU/L Final   SGPT (ALT)  Date Value Ref Range Status  08/01/2012 45 12 - 78 U/L Final         Passed - Cr in normal range and within 360 days    Creatinine  Date Value Ref Range Status  08/02/2012 1.07 0.60 - 1.30 mg/dL Final   Creatinine, Ser  Date Value Ref Range Status  02/10/2022 1.00 0.76 - 1.27 mg/dL Final         Passed - AST in normal range and within 360 days    AST  Date Value Ref Range Status  02/10/2022 36 0 - 40 IU/L Final   SGOT(AST)  Date Value Ref Range Status   08/01/2012 20 15 - 37 Unit/L Final         Passed - Last BP in normal range    BP Readings from Last 1 Encounters:  05/31/22 108/72         Passed - Valid encounter within last 6 months    Recent Outpatient Visits           Today [place] at [place], Earley Abide, MD   3 months ago Annual physical exam   [place] at [place], Earley Abide, MD   5 months ago [place] at [place], Earley Abide, MD                 Requested Prescriptions  Pending Prescriptions Disp Refills   PARoxetine (PAXIL-CR) 12.5 MG 24 hr tablet [Pharmacy Med Name: PAROXETINE ER 12.5 MG TABLET] 90 tablet 1    Sig: TAKE 1 TABLET BY MOUTH  [place]     Psychiatry:  Antidepressants - SSRI Passed - 05/30/2022  9:32 AM      Passed - Valid encounter within last 6 months    Recent Outpatient Visits           Today [place] at [place], Earley Abide, MD   3 months ago Annual physical exam   [place] at [facility], Earley Abide, MD   5 months ago [place] [facility] [place], MD              Refused Prescriptions Disp Refills   buPROPion (WELLBUTRIN XL) 150 MG 24 hr tablet [Pharmacy Med Name: BUPROPION HCL XL 150 MG TABLET] 180 tablet 3    Sig: TAKE 1 TABLET DAILY FOR A MONTH THEN 1 TAB TWICE A DAY     Psychiatry: Antidepressants - bupropion Failed - 05/30/2022  9:32 AM      Failed - ALT in normal range and within 360 days    ALT  Date Value Ref Range Status  02/10/2022 48 (H) 0 - 44 IU/L Final   SGPT (ALT)  Date Value Ref Range Status  08/01/2012 45 12 - 78 U/L Final         Passed - Cr in normal range and within 360 days    Creatinine  Date Value Ref Range Status   08/02/2012 1.07 0.60 - 1.30 mg/dL Final   Creatinine, Ser  Date Value Ref Range Status  02/10/2022 1.00 0.76 - 1.27 mg/dL Final         Passed - AST in normal range and within 360 days    AST  Date Value Ref Range Status  02/10/2022 36 0 - 40 IU/L Final   SGOT(AST)  Date Value Ref Range Status  08/01/2012 20 15 - 37 Unit/L Final         Passed - Last BP in normal range    BP Readings from Last 1 Encounters:  05/31/22 108/72         Passed - Valid encounter within last 6 months    Recent Outpatient Visits           Today [place] at [place], Earley Abide, MD   3 months ago Annual physical exam   [place] at [place], Earley Abide, MD   5 months ago [place] at [facility], Earley Abide, MD

## 2022-05-31 NOTE — Assessment & Plan Note (Signed)
Chronic condition, well-controlled, refill current medications.

## 2022-05-31 NOTE — Patient Instructions (Signed)
-   Continue current medications - Use CPAP consistently - Keep follow-up with GI group - Return for physical 01/2023

## 2022-06-01 ENCOUNTER — Encounter: Payer: Self-pay | Admitting: Family Medicine

## 2022-06-02 ENCOUNTER — Telehealth: Payer: Self-pay | Admitting: Family Medicine

## 2022-06-02 ENCOUNTER — Other Ambulatory Visit: Payer: Self-pay | Admitting: Family Medicine

## 2022-06-02 DIAGNOSIS — F419 Anxiety disorder, unspecified: Secondary | ICD-10-CM

## 2022-06-02 MED ORDER — BUPROPION HCL ER (XL) 300 MG PO TB24: 300.0000 mg | ORAL_TABLET | Freq: Every day | ORAL | 3 refills | Status: DC

## 2022-06-02 NOTE — Telephone Encounter (Unsigned)
Copied from [place] [id number]. Topic: General - Other >> Jun 02, 2022  4:22 PM Everette C wrote: Reason for CRM: Medication Refill - Medication: buPROPion (WELLBUTRIN XL) 150 MG 24 hr tablet [id number]  Has the patient contacted their pharmacy? Yes.   (Agent: If no, request that the patient contact the pharmacy for the refill. If patient does not wish to contact the pharmacy document the reason why and proceed with request.) (Agent: If yes, when and what did the pharmacy advise?)  Preferred Pharmacy (with phone number or street name): [id number] [address] [id number]: [phone]: [phone]: Not open 24 hours   Has the patient been seen for an appointment in the last year OR does the patient have an upcoming appointment? Yes.    Agent: Please be advised that RX refills may take up to 3 business days. We ask that you follow-up with your pharmacy.

## 2022-06-02 NOTE — Telephone Encounter (Signed)
Please review.  KP

## 2022-06-05 ENCOUNTER — Telehealth: Payer: Self-pay

## 2022-06-05 NOTE — Telephone Encounter (Signed)
PA completed waiting on insurance approval.  Key: [id number]  KP

## 2022-06-05 NOTE — Telephone Encounter (Signed)
Refill sent 06/02/2022.  KP

## 2022-06-06 NOTE — Telephone Encounter (Signed)
Called pt left VM that patches where denied by insurance. Also stated that patches can be purchased over the counter.  KP

## 2022-07-28 ENCOUNTER — Telehealth: Payer: Self-pay | Admitting: Family Medicine

## 2022-07-28 ENCOUNTER — Ambulatory Visit: Payer: Self-pay | Admitting: *Deleted

## 2022-07-28 NOTE — Telephone Encounter (Signed)
PC to pharmacy, pt had RX transferred to [place] so there for it was no longer in [place], asked pharmacy to transfer back called pt and explained to him what happened he understood. Advised pt to pick up new RX and talk with pharmacy about the wrong one being filled. Pt voiced understanding

## 2022-07-28 NOTE — Telephone Encounter (Signed)
Summary: Medication Advice   Pt is calling to report that his wife went to pick up his medication was not there buPROPion (WELLBUTRIN XL) 300 MG 24 hr tablet [[id number]] . Wellbutrin 150 mg SR-was given to the patient wanting to know what he should he do? Pharmacy advised that the SR medication is for pts that smoke. Pt does not smoke. Please advise          Per chart: 02/09/22:  buPROPion HCl 150 MG 1 TABLET DAILY FOR A MONTH THEN 1 TAB TWICE A DAY 06/02/22: buPROPion (WELLBUTRIN XL) 300 MG 24 hr tablet 90 tablet 3 06/02/2022   Take 1 tablet (300 mg total) by mouth daily. - Oral   Chief Complaint: patient got wrong medication from pharmacy: Wellbutrin 150mg  SR- is wrong Symptoms: anxiety Frequency: chronic  Disposition: [] ED /[] Urgent Care (no appt availability in office) / [] Appointment(In office/virtual)/ []  [place]/ [] Home Care/ [] Refused Recommended Disposition /[]  Mobile Bus/ [x]  Follow-up with PCP Additional Notes: Patient has gotten the wrong Rx- he has paid $50 for wrong Rx- can this be corrected so he can get the correct medication- he really doesn't want to pat for Rx again.   Reason for Disposition . [1] Caller has URGENT medicine question about med that PCP or specialist prescribed AND [2] triager unable to answer question  Answer Assessment - Initial Assessment Questions 1. NAME of MEDICINE: "What medicine(s) are you calling about?"     Patient was given wrong Rx- he was given Wellbutrin 150mg  SR- he takes 300mg  XL 2. QUESTION: "What is your question?" (e.g., double dose of medicine, side effect)     Can the office fix this so he can return medication to pharmacy and get what he needs- he spent $50 on wrong medication  3. PRESCRIBER: "Who prescribed the medicine?" Reason: if prescribed by specialist, call should be referred to that group.     PCP  Protocols used: Medication Question Call-A-AH

## 2022-08-03 ENCOUNTER — Other Ambulatory Visit: Payer: Self-pay | Admitting: Family Medicine

## 2022-08-03 DIAGNOSIS — K219 Gastro-esophageal reflux disease without esophagitis: Secondary | ICD-10-CM

## 2022-08-03 NOTE — Telephone Encounter (Signed)
Requested medication (s) are due for refill today: yes  Requested medication (s) are on the active medication list: yes  Last refill:  05/31/22  Future visit scheduled: yes  Notes to clinic:    Pharmacy comment: Alternative Requested:PA REQUIRED.       Requested Prescriptions  Pending Prescriptions Disp Refills   lansoprazole (PREVACID) 30 MG capsule [Pharmacy Med Name: LANSOPRAZOLE DR 30 MG CAPSULE]  0     Gastroenterology: Proton Pump Inhibitors 2 Failed - 08/03/2022  7:36 AM      Failed - ALT in normal range and within 360 days    ALT  Date Value Ref Range Status  02/10/2022 48 (H) 0 - 44 IU/L Final   SGPT (ALT)  Date Value Ref Range Status  08/01/2012 45 12 - 78 U/L Final         Passed - AST in normal range and within 360 days    AST  Date Value Ref Range Status  02/10/2022 36 0 - 40 IU/L Final   SGOT(AST)  Date Value Ref Range Status  08/01/2012 20 15 - 37 Unit/L Final         Passed - Valid encounter within last 12 months    Recent Outpatient Visits           2 months ago Thoracolumbar back pain   [place] [facility] at [facility], Ocie Bob, MD   5 months ago Annual physical exam   [facility] at [facility], Ocie Bob, MD   8 months ago Anxiety   [facility] Primary Care & Sports Medicine at [facility], Ocie Bob, MD       Future Appointments             In 6 months Ashley Royalty, Ocie Bob, MD [facility] Primary Care & Sports Medicine at [facility], [facility]

## 2022-08-04 DIAGNOSIS — J019 Acute sinusitis, unspecified: Secondary | ICD-10-CM | POA: Diagnosis not present

## 2022-08-04 DIAGNOSIS — B9689 Other specified bacterial agents as the cause of diseases classified elsewhere: Secondary | ICD-10-CM | POA: Diagnosis not present

## 2022-08-04 DIAGNOSIS — R0981 Nasal congestion: Secondary | ICD-10-CM | POA: Diagnosis not present

## 2022-08-04 NOTE — Telephone Encounter (Signed)
Please advise 

## 2022-08-15 ENCOUNTER — Other Ambulatory Visit: Payer: Self-pay

## 2022-08-15 DIAGNOSIS — K219 Gastro-esophageal reflux disease without esophagitis: Secondary | ICD-10-CM

## 2022-08-15 MED ORDER — LANSOPRAZOLE 30 MG PO CPDR: 30.0000 mg | DELAYED_RELEASE_CAPSULE | Freq: Every day | ORAL | 3 refills | Status: DC

## 2022-09-05 ENCOUNTER — Other Ambulatory Visit: Payer: Self-pay

## 2022-09-05 ENCOUNTER — Encounter: Payer: Self-pay | Admitting: Family Medicine

## 2022-09-05 DIAGNOSIS — K219 Gastro-esophageal reflux disease without esophagitis: Secondary | ICD-10-CM

## 2022-09-05 NOTE — Telephone Encounter (Signed)
Spoke with pharmacy and patient, patient is getting it filled and using good rx

## 2023-02-12 ENCOUNTER — Ambulatory Visit (INDEPENDENT_AMBULATORY_CARE_PROVIDER_SITE_OTHER): Payer: 59 | Admitting: Family Medicine

## 2023-02-12 ENCOUNTER — Ambulatory Visit
Admission: RE | Admit: 2023-02-12 | Discharge: 2023-02-12 | Disposition: A | Discharge status: Home / Self Care | Payer: 59 | Source: Ambulatory Visit | Attending: Family Medicine | Admitting: Family Medicine

## 2023-02-12 ENCOUNTER — Encounter: Payer: Self-pay | Admitting: Family Medicine

## 2023-02-12 VITALS — BP 110/78 | HR 77 | Ht 72.0 in | Wt 254.0 lb

## 2023-02-12 DIAGNOSIS — Z125 Encounter for screening for malignant neoplasm of prostate: Secondary | ICD-10-CM | POA: Diagnosis not present

## 2023-02-12 DIAGNOSIS — Z1211 Encounter for screening for malignant neoplasm of colon: Secondary | ICD-10-CM | POA: Diagnosis not present

## 2023-02-12 DIAGNOSIS — K760 Fatty (change of) liver, not elsewhere classified: Secondary | ICD-10-CM | POA: Diagnosis not present

## 2023-02-12 DIAGNOSIS — E785 Hyperlipidemia, unspecified: Secondary | ICD-10-CM | POA: Diagnosis not present

## 2023-02-12 DIAGNOSIS — Z131 Encounter for screening for diabetes mellitus: Secondary | ICD-10-CM | POA: Diagnosis not present

## 2023-02-12 DIAGNOSIS — Z0000 Encounter for general adult medical examination without abnormal findings: Secondary | ICD-10-CM

## 2023-02-12 DIAGNOSIS — Z23 Encounter for immunization: Secondary | ICD-10-CM | POA: Diagnosis not present

## 2023-02-12 DIAGNOSIS — R1084 Generalized abdominal pain: Secondary | ICD-10-CM | POA: Diagnosis not present

## 2023-02-12 DIAGNOSIS — R1011 Right upper quadrant pain: Secondary | ICD-10-CM | POA: Insufficient documentation

## 2023-02-12 DIAGNOSIS — M6208 Separation of muscle (nontraumatic), other site: Secondary | ICD-10-CM | POA: Diagnosis not present

## 2023-02-12 DIAGNOSIS — E559 Vitamin D deficiency, unspecified: Secondary | ICD-10-CM | POA: Diagnosis not present

## 2023-02-12 DIAGNOSIS — G4733 Obstructive sleep apnea (adult) (pediatric): Secondary | ICD-10-CM | POA: Diagnosis not present

## 2023-02-12 DIAGNOSIS — F419 Anxiety disorder, unspecified: Secondary | ICD-10-CM

## 2023-02-12 DIAGNOSIS — K8020 Calculus of gallbladder without cholecystitis without obstruction: Secondary | ICD-10-CM | POA: Diagnosis not present

## 2023-02-12 NOTE — Assessment & Plan Note (Signed)
Compliant, follows with Pulmonology, just received new CPAP.

## 2023-02-12 NOTE — Assessment & Plan Note (Signed)
1 month history of progressive generalized abdominal pain that has become more focal to the RUQ, denies any aggravating factors (food intake, muscular straining) and cannot recall any changes at onset. Given his exam findings concern for cholecystis, hepatopancreatic concerns.   Plan: - Stat RUQ U/S - Serum studies - Referral to GI for further evaluation and as he is due for colonoscopy - Dietary modifications over interim and guidelines for ER

## 2023-02-12 NOTE — Assessment & Plan Note (Signed)
Noted in the past, now with RUQ progressive pain, will be checked on U/S ordered today.

## 2023-02-12 NOTE — Addendum Note (Signed)
Addended by: Jerrol Banana on: 02/12/2023 12:21 PM   Modules accepted: Orders

## 2023-02-12 NOTE — Assessment & Plan Note (Signed)
Doing well on current regimen .  

## 2023-02-12 NOTE — Patient Instructions (Addendum)
-   Obtain fasting labs with orders provided (can have water or black coffee but otherwise no food or drink x 8 hours before labs) - Review information provided - Attend eye doctor annually, dentist every 6 months, work towards or maintain 30 minutes of moderate intensity physical activity at least 5 days per week, and consume a balanced diet - Return in 1 year for physical - Contact us for any questions between now and then  Additionally: - Coordinator will contact you to schedule ultrasound and visit with GI - Eat light foods / bland diet - Contact for any worsening, for any severe pain, go to ER

## 2023-02-12 NOTE — Assessment & Plan Note (Signed)
Incidentally noted on exam, asymptomatic. Discussed management options (surgical) and the nature of this condition.

## 2023-02-12 NOTE — Progress Notes (Signed)
Annual Physical Exam Visit  Patient Information:  Patient ID: Isaiah Gonzalez, male DOB: April 08, 1976 Age: 47 y.o. MRN: [id number]   Subjective:   CC: Annual Physical Exam  HPI:  Isaiah Gonzalez is here for their annual physical.  I reviewed the past medical history, family history, social history, surgical history, and allergies today and changes were made as necessary.  Please see the problem list section below for additional details.  Past Medical History: Past Medical History:  Diagnosis Date   Enlarged liver    Sleep apnea    Past Surgical History: Past Surgical History:  Procedure Laterality Date   APPENDECTOMY     INGUINAL HERNIA REPAIR     KNEE SURGERY     lt. arm surgery     Family History: Family History  Problem Relation Age of Onset   Heart attack Father    Heart attack Maternal Grandfather    Allergies: Allergies  Allergen Reactions   Acetaminophen Other (See Comments)    Liver complications Can't take due to having fatty liver Liver complications   Morphine And Codeine Itching    Pt states he not allergic Pt states he not allergic   Health Maintenance: Health Maintenance  Topic Date Due   Colonoscopy  Never done   INFLUENZA VACCINE  11/23/2022   COVID-19 Vaccine (3 - 2023-24 season) 12/24/2022   DTaP/Tdap/Td (3 - Td or Tdap) 04/24/2030   Hepatitis C Screening  Completed   HIV Screening  Completed   HPV VACCINES  Aged Out    HM Colonoscopy          Overdue - Colonoscopy (Every 10 Years) Never done    No completion history exists for this topic.           Medications: Current Outpatient Medications on File Prior to Visit  Medication Sig Dispense Refill   B Complex Vitamins (VITAMIN B COMPLEX PO) Take by mouth.     buPROPion (WELLBUTRIN XL) 300 MG 24 hr tablet Take 1 tablet (300 mg total) by mouth daily. 90 tablet 3   lansoprazole (PREVACID) 30 MG capsule Take 1 capsule (30 mg total) by mouth daily at 12 noon. 90  capsule 3   lidocaine (LIDODERM) 5 % Place 1 patch onto the skin every 12 (twelve) hours. Remove & Discard patch within 12 hours or as directed by MD 30 patch 2   PARoxetine (PAXIL-CR) 12.5 MG 24 hr tablet Take 1 tablet (12.5 mg total) by mouth daily. 90 tablet 2   No current facility-administered medications on file prior to visit.    Objective:  There were no vitals filed for this visit. Vitals:   02/12/23 0808  Height: 6' (1.829 m)   Body mass index is 33.63 kg/m.  General: Well Developed, well nourished, and in no acute distress.  Neuro: Alert and oriented x3, extra-ocular muscles intact, sensation grossly intact. Cranial nerves II through XII are grossly intact, motor, sensory, and coordinative functions are intact. HEENT: Normocephalic, atraumatic, neck supple, no masses, no lymphadenopathy, thyroid nonenlarged. Oropharynx, nasopharynx, external ear canals are unremarkable. Skin: Warm and dry, no rashes noted.  Cardiac: Regular rate and rhythm, no murmurs rubs or gallops. No peripheral edema. Pulses symmetric. Respiratory: Clear to auscultation bilaterally. Speaking in full sentences.  Abdominal: Soft, RUQ tenderness with +Murphy's, -Carnett Sign, nondistended, positive normoactive bowel sounds, diastasis recti, no organomegaly. Musculoskeletal: Stable, and with full range of motion.  Impression and Recommendations:   The patient was counselled, risk  factors were discussed, and anticipatory guidance given.  Problem List Items Addressed This Visit       Respiratory   OSA on CPAP    Compliant, follows with Pulmonology, just received new CPAP.        Digestive   Fatty liver    Noted in the past, now with RUQ progressive pain, will be checked on U/S ordered today.      Relevant Orders   Comprehensive metabolic panel   Lipid panel   Ambulatory referral to Gastroenterology     Musculoskeletal and Integument   Diastasis recti    Incidentally noted on exam,  asymptomatic. Discussed management options (surgical) and the nature of this condition.        Other   Right upper quadrant abdominal pain    1 month history of progressive generalized abdominal pain that has become more focal to the RUQ, denies any aggravating factors (food intake, muscular straining) and cannot recall any changes at onset. Given his exam findings concern for cholecystis, hepatopancreatic concerns.   Plan: - Stat RUQ U/S - Serum studies - Referral to GI for further evaluation and as he is due for colonoscopy - Dietary modifications over interim and guidelines for ER      Relevant Orders   Amylase   Lipase   Bilirubin, fractionated (tot/dir/indir)   US Abdomen Limited RUQ (LIVER/GB)   Ambulatory referral to Gastroenterology   Need for immunization against influenza    Administered today      Healthcare maintenance - Primary    Annual examination completed, risk stratification labs ordered, anticipatory guidance provided.  We will follow labs once resulted.      Relevant Orders   CBC   Comprehensive metabolic panel   Hemoglobin A1c   Lipid panel   PSA Total (Reflex To Free)   TSH   VITAMIN D 25 Hydroxy (Vit-D Deficiency, Fractures)   Colon cancer screening    Referral to GI placed, See additional assessment(s) for plan details.      Relevant Orders   Ambulatory referral to Gastroenterology   Anxiety    Doing well on current regimen.      Other Visit Diagnoses     Hyperlipidemia, unspecified hyperlipidemia type       Relevant Orders   Comprehensive metabolic panel   Lipid panel   Vitamin D deficiency       Relevant Orders   VITAMIN D 25 Hydroxy (Vit-D Deficiency, Fractures)   Screening for prostate cancer       Relevant Orders   PSA Total (Reflex To Free)   Screening for diabetes mellitus       Relevant Orders   Comprehensive metabolic panel   Hemoglobin A1c   Need for influenza vaccination       Relevant Orders   Flu vaccine trivalent  PF, 6mos and older(Flulaval,Afluria,Fluarix,Fluzone)        Orders & Medications Medications: No orders of the defined types were placed in this encounter.  Orders Placed This Encounter  Procedures   US Abdomen Limited RUQ (LIVER/GB)   Flu vaccine trivalent PF, 6mos and older(Flulaval,Afluria,Fluarix,Fluzone)   CBC   Comprehensive metabolic panel   Hemoglobin A1c   Lipid panel   PSA Total (Reflex To Free)   TSH   VITAMIN D 25 Hydroxy (Vit-D Deficiency, Fractures)   Amylase   Lipase   Bilirubin, fractionated (tot/dir/indir)   Ambulatory referral to Gastroenterology     Return in about 1 year (around 02/12/2024) for CPE.  Jerrol Banana, MD, [facility]   Primary Care Sports Medicine Primary Care and Sports Medicine at [facility]

## 2023-02-12 NOTE — Assessment & Plan Note (Signed)
Referral to GI placed, See additional assessment(s) for plan details.

## 2023-02-12 NOTE — Assessment & Plan Note (Signed)
Administered today.

## 2023-02-12 NOTE — Assessment & Plan Note (Signed)
Annual examination completed, risk stratification labs ordered, anticipatory guidance provided.  We will follow labs once resulted. 

## 2023-02-13 LAB — COMPREHENSIVE METABOLIC PANEL
ALT: 54 [IU]/L — ABNORMAL HIGH (ref 0–44)
AST: 35 [IU]/L (ref 0–40)
Albumin: 4.2 g/dL (ref 4.1–5.1)
Alkaline Phosphatase: 70 [IU]/L (ref 44–121)
BUN/Creatinine Ratio: 7 — ABNORMAL LOW (ref 9–20)
BUN: 8 mg/dL (ref 6–24)
Bilirubin Total: 0.4 mg/dL (ref 0.0–1.2)
CO2: 24 mmol/L (ref 20–29)
Calcium: 9.2 mg/dL (ref 8.7–10.2)
Chloride: 104 mmol/L (ref 96–106)
Creatinine, Ser: 1.07 mg/dL (ref 0.76–1.27)
Globulin, Total: 2.5 g/dL (ref 1.5–4.5)
Glucose: 87 mg/dL (ref 70–99)
Potassium: 4.2 mmol/L (ref 3.5–5.2)
Sodium: 142 mmol/L (ref 134–144)
Total Protein: 6.7 g/dL (ref 6.0–8.5)
eGFR: 87 mL/min/{1.73_m2} (ref >59)

## 2023-02-13 LAB — LIPID PANEL
Chol/HDL Ratio: 4.7 ratio (ref 0.0–5.0)
Cholesterol, Total: 140 mg/dL (ref 100–199)
HDL: 30 mg/dL — ABNORMAL LOW (ref >39)
LDL Chol Calc (NIH): 91 mg/dL (ref 0–99)
Triglycerides: 99 mg/dL (ref 0–149)
VLDL Cholesterol Cal: 19 mg/dL (ref 5–40)

## 2023-02-13 LAB — CBC
Hematocrit: 50.2 % (ref 37.5–51.0)
Hemoglobin: 16.5 g/dL (ref 13.0–17.7)
MCH: 30.2 pg (ref 26.6–33.0)
MCHC: 32.9 g/dL (ref 31.5–35.7)
MCV: 92 fL (ref 79–97)
Platelets: 204 10*3/uL (ref 150–450)
RBC: 5.47 x10E6/uL (ref 4.14–5.80)
RDW: 13 % (ref 11.6–15.4)
WBC: 6.3 10*3/uL (ref 3.4–10.8)

## 2023-02-13 LAB — BILIRUBIN, FRACTIONATED(TOT/DIR/INDIR)
Bilirubin Total: 0.4 mg/dL (ref 0.0–1.2)
Bilirubin, Direct: 0.13 mg/dL (ref 0.00–0.40)
Bilirubin, Indirect: 0.27 mg/dL (ref 0.10–0.80)

## 2023-02-13 LAB — HEMOGLOBIN A1C
Est. average glucose Bld gHb Est-mCnc: 114 mg/dL
Hgb A1c MFr Bld: 5.6 % (ref 4.8–5.6)

## 2023-02-13 LAB — PSA TOTAL (REFLEX TO FREE): Prostate Specific Ag, Serum: 1.2 ng/mL (ref 0.0–4.0)

## 2023-02-13 LAB — LIPASE: Lipase: 26 U/L (ref 13–78)

## 2023-02-13 LAB — AMYLASE: Amylase: 60 U/L (ref 31–110)

## 2023-02-13 LAB — TSH: TSH: 1.89 u[IU]/mL (ref 0.450–4.500)

## 2023-02-13 LAB — VITAMIN D 25 HYDROXY (VIT D DEFICIENCY, FRACTURES): Vit D, 25-Hydroxy: 31.7 ng/mL (ref 30.0–100.0)

## 2023-02-14 NOTE — Progress Notes (Unsigned)
Patient ID: Isaiah Gonzalez, male   DOB: 05-Apr-1976, 47 y.o.   MRN: [id number]  Chief Complaint: Right upper quadrant pain  History of Present Illness Isaiah Gonzalez is a 47 y.o. male with postprandial right upper quadrant pain, increasing somewhat in frequency over the last 4 weeks.  He is obtained an ultrasound which revealed gallstones.  He reports occasional nausea, but significantly increased flatus.  He reports some irregularity of bowel activity, with more loose stools.  He currently reports a significant amount of agenda in helping his wife with her business and desires to defer elective surgery until early December if feasible.  Past Medical History Past Medical History:  Diagnosis Date   Enlarged liver    Sleep apnea       Past Surgical History:  Procedure Laterality Date   APPENDECTOMY     INGUINAL HERNIA REPAIR     KNEE SURGERY     lt. arm surgery      Allergies  Allergen Reactions   Acetaminophen Other (See Comments)    Liver complications Can't take due to having fatty liver Liver complications    Current Outpatient Medications  Medication Sig Dispense Refill   B Complex Vitamins (VITAMIN B COMPLEX PO) Take by mouth.     buPROPion (WELLBUTRIN XL) 300 MG 24 hr tablet Take 1 tablet (300 mg total) by mouth daily. 90 tablet 3   lansoprazole (PREVACID) 30 MG capsule Take 1 capsule (30 mg total) by mouth daily at 12 noon. 90 capsule 3   PARoxetine (PAXIL-CR) 12.5 MG 24 hr tablet Take 1 tablet (12.5 mg total) by mouth daily. 90 tablet 2   No current facility-administered medications for this visit.    Family History Family History  Problem Relation Age of Onset   Heart attack Father    Heart attack Maternal Grandfather       Social History Social History   Tobacco Use   Smoking status: Never  Vaping Use   Vaping status: Never Used  Substance Use Topics   Alcohol use: No   Drug use: No        Review of Systems  Constitutional: Negative.    HENT: Negative.    Eyes: Negative.   Respiratory: Negative.    Cardiovascular: Negative.   Gastrointestinal:  Positive for abdominal pain.  Genitourinary: Negative.   Skin: Negative.   Neurological: Negative.   Psychiatric/Behavioral: Negative.       Physical Exam Blood pressure (!) 124/90, pulse 97, temperature 98 F (36.7 C), height 5\' 11"  (1.803 m), weight 254 lb (115.2 kg), SpO2 97%. Last Weight  Most recent update: 02/15/2023 10:01 AM    Weight  115.2 kg (254 lb)             CONSTITUTIONAL: Well developed, and nourished, appropriately responsive and aware without distress.   EYES: Sclera non-icteric.   EARS, NOSE, MOUTH AND THROAT:  The oropharynx is clear. Oral mucosa is pink and moist.    Hearing is intact to voice.  NECK: Trachea is midline, and there is no jugular venous distension.  LYMPH NODES:  Lymph nodes in the neck are not appreciated. RESPIRATORY:  Lungs are clear, and breath sounds are equal bilaterally.  Normal respiratory effort without pathologic use of accessory muscles. CARDIOVASCULAR: Heart is regular in rate and rhythm.   Well perfused.  GI: The abdomen is well-rounded, otherwise soft, nontender, and nondistended. There were no palpable masses.  I did not appreciate hepatosplenomegaly.  MUSCULOSKELETAL:  Symmetrical muscle  tone appreciated in all four extremities.    SKIN: Skin turgor is normal. No pathologic skin lesions appreciated.  NEUROLOGIC:  Motor and sensation appear grossly normal.  Cranial nerves are grossly without defect. PSYCH:  Alert and oriented to person, place and time. Affect is appropriate for situation.  Data Reviewed I have personally reviewed what is currently available of the patient's imaging, recent labs and medical records.   Labs:     Latest Ref Rng & Units 02/12/2023    9:28 AM 02/10/2022    8:20 AM 10/24/2016   12:44 PM  CBC  WBC 3.4 - 10.8 x10E3/uL 6.3  7.2  7.5   Hemoglobin 13.0 - 17.7 g/dL 62.1  30.8  65.7    Hematocrit 37.5 - 51.0 % 50.2  50.8  48.0   Platelets 150 - 450 x10E3/uL 204  214  185       Latest Ref Rng & Units 02/12/2023    9:28 AM 02/10/2022    8:20 AM 10/24/2016   12:44 PM  CMP  Glucose 70 - 99 mg/dL 87  90  97   BUN 6 - 24 mg/dL 8  8  14    Creatinine 0.76 - 1.27 mg/dL 8.46  9.62  9.52   Sodium 134 - 144 mmol/L 142  141  140   Potassium 3.5 - 5.2 mmol/L 4.2  4.0  3.7   Chloride 96 - 106 mmol/L 104  103  107   CO2 20 - 29 mmol/L 24  22  27    Calcium 8.7 - 10.2 mg/dL 9.2  9.0  8.9   Total Protein 6.0 - 8.5 g/dL 6.7  6.9    Total Bilirubin 0.0 - 1.2 mg/dL 0.0 - 1.2 mg/dL 0.4    0.4  0.6    Alkaline Phos 44 - 121 IU/L 70  73    AST 0 - 40 IU/L 35  36    ALT 0 - 44 IU/L 54  48       Imaging: Radiological images reviewed:  CLINICAL DATA:  Right upper quadrant abdominal tenderness.   EXAM: ULTRASOUND ABDOMEN LIMITED RIGHT UPPER QUADRANT   COMPARISON:  None Available.   FINDINGS: Gallbladder:   Cholelithiasis is noted without gallbladder wall thickening or pericholecystic fluid. Positive sonographic Murphy's sign is noted.   Common bile duct:   Diameter: 4 mm which is within normal limits.   Liver:   No focal lesion identified. Increased echogenicity of hepatic parenchyma is noted. Portal vein is patent on color Doppler imaging with normal direction of blood flow towards the liver.   Other: None.   IMPRESSION: Cholelithiasis without gallbladder wall thickening or pericholecystic fluid, but positive sonographic Murphy's sign is noted. If there is clinical concern for cholecystitis, HIDA scan is recommended for further evaluation.   Hepatic steatosis.     Electronically Signed   By: Lupita Raider M.D.   On: 02/12/2023 11:54 Within last 24 hrs: No results found.  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  Optimal plan is for robotic cholecystectomy utilizing ICG imaging. Risks and benefits have been discussed with the patient which include but are not limited to anesthesia, bleeding, infection, biliary ductal injury, resulting in leak or stenosis, other associated unanticipated injuries affiliated with laparoscopic surgery.   Reviewed that removing the gallbladder will only address the symptoms related to the gallbladder itself.  I believe there is the desire to proceed, accepting the risks with understanding.  Questions elicited and answered to satisfaction.    No guarantees ever expressed or implied.    Face-to-face time spent with the patient and accompanying care providers(if present) was 40 minutes, with more than 50% of the time spent counseling, educating, and coordinating care of the patient.    These notes generated with voice recognition software. I apologize for typographical errors.  Campbell Lerner M.D., FACS 02/15/2023, 2:19 PM

## 2023-02-15 ENCOUNTER — Telehealth: Payer: Self-pay | Admitting: Surgery

## 2023-02-15 ENCOUNTER — Ambulatory Visit: Payer: 59 | Admitting: Surgery

## 2023-02-15 ENCOUNTER — Encounter: Payer: Self-pay | Admitting: Surgery

## 2023-02-15 ENCOUNTER — Ambulatory Visit: Payer: Self-pay | Admitting: Surgery

## 2023-02-15 VITALS — BP 124/90 | HR 97 | Temp 98.0°F | Ht 71.0 in | Wt 254.0 lb

## 2023-02-15 DIAGNOSIS — K8010 Calculus of gallbladder with chronic cholecystitis without obstruction: Secondary | ICD-10-CM

## 2023-02-15 HISTORY — DX: Calculus of gallbladder with chronic cholecystitis without obstruction: K80.10

## 2023-02-15 NOTE — Telephone Encounter (Signed)
Patient has been advised of Pre-Admission date/time, and Surgery date at [facility].  Surgery Date: 03/30/23 Preadmission Testing Date: 03/21/23 (phone 1p-4p)  Patient has been made aware to call [phone], between 1-3:00pm the day before surgery, to find out what time to arrive for surgery.

## 2023-02-15 NOTE — Patient Instructions (Signed)
You have requested to have your gallbladder removed. This will be done at [facility] with Dr. Claudine Mouton.  You will most likely be out of work 1-2 weeks for this surgery.  If you have FMLA or disability paperwork that needs filled out you may drop this off at our office or this can be faxed to [phone].  You will return after your post-op appointment with a lifting restriction for approximately 4 more weeks.  You will be able to eat anything you would like to following surgery. But, start by eating a bland diet and advance this as tolerated. The Gallbladder diet is below, please go as closely by this diet as possible prior to surgery to avoid any further attacks.  Please see the (blue)pre-care form that you have been given today. Our surgery scheduler will call you to verify surgery date and to go over information.   If you have any questions, please call our office.  Laparoscopic Cholecystectomy Laparoscopic cholecystectomy is surgery to remove the gallbladder. The gallbladder is located in the upper right part of the abdomen, behind the liver. It is a storage sac for bile, which is produced in the liver. Bile aids in the digestion and absorption of fats. Cholecystectomy is often done for inflammation of the gallbladder (cholecystitis). This condition is usually caused by a buildup of gallstones (cholelithiasis) in the gallbladder. Gallstones can block the flow of bile, and that can result in inflammation and pain. In severe cases, emergency surgery may be required. If emergency surgery is not required, you will have time to prepare for the procedure. Laparoscopic surgery is an alternative to open surgery. Laparoscopic surgery has a shorter recovery time. Your common bile duct may also need to be examined during the procedure. If stones are found in the common bile duct, they may be removed. LET [facility] CARE PROVIDER KNOW ABOUT: Any allergies you have. All medicines you are taking,  including vitamins, herbs, eye drops, creams, and over-the-counter medicines. Previous problems you or members of your family have had with the use of anesthetics. Any blood disorders you have. Previous surgeries you have had.  Any medical conditions you have. RISKS AND COMPLICATIONS Generally, this is a safe procedure. However, problems may occur, including: Infection. Bleeding. Allergic reactions to medicines. Damage to other structures or organs. A stone remaining in the common bile duct. A bile leak from the cyst duct that is clipped when your gallbladder is removed. The need to convert to open surgery, which requires a larger incision in the abdomen. This may be necessary if your surgeon thinks that it is not safe to continue with a laparoscopic procedure. BEFORE THE PROCEDURE Ask your health care provider about: Changing or stopping your regular medicines. This is especially important if you are taking diabetes medicines or blood thinners. Taking medicines such as aspirin and ibuprofen. These medicines can thin your blood. Do not take these medicines before your procedure if your health care provider instructs you not to. Follow instructions from your health care provider about eating or drinking restrictions. Let your health care provider know if you develop a cold or an infection before surgery. Plan to have someone take you home after the procedure. Ask your health care provider how your surgical site will be marked or identified. You may be given antibiotic medicine to help prevent infection. PROCEDURE To reduce your risk of infection: Your health care team will wash or sanitize their hands. Your skin will be washed with soap. An IV  tube may be inserted into one of your veins. You will be given a medicine to make you fall asleep (general anesthetic). A breathing tube will be placed in your mouth. The surgeon will make several small cuts (incisions) in your abdomen. A thin,  lighted tube (laparoscope) that has a tiny camera on the end will be inserted through one of the small incisions. The camera on the laparoscope will send a picture to a TV screen (monitor) in the operating room. This will give the surgeon a good view inside your abdomen. A gas will be pumped into your abdomen. This will expand your abdomen to give the surgeon more room to perform the surgery. Other tools that are needed for the procedure will be inserted through the other incisions. The gallbladder will be removed through one of the incisions. After your gallbladder has been removed, the incisions will be closed with stitches (sutures), staples, or skin glue. Your incisions may be covered with a bandage (dressing). The procedure may vary among health care providers and hospitals. AFTER THE PROCEDURE Your blood pressure, heart rate, breathing rate, and blood oxygen level will be monitored often until the medicines you were given have worn off. You will be given medicines as needed to control your pain.   This information is not intended to replace advice given to you by your health care provider. Make sure you discuss any questions you have with your health care provider.   Document Released: 04/10/2005 Document Revised: 12/30/2014 Document Reviewed: 11/20/2012 Elsevier Interactive Patient Education 2016 Elsevier Inc.   Low-Fat Diet for Gallbladder Conditions A low-fat diet can be helpful if you have pancreatitis or a gallbladder condition. With these conditions, your pancreas and gallbladder have trouble digesting fats. A healthy eating plan with less fat will help rest your pancreas and gallbladder and reduce your symptoms. WHAT DO I NEED TO KNOW ABOUT THIS DIET? Eat a low-fat diet. Reduce your fat intake to less than 20-30% of your total daily calories. This is less than 50-60 g of fat per day. Remember that you need some fat in your diet. Ask your dietician what your daily goal should  be. Choose nonfat and low-fat healthy foods. Look for the words "nonfat," "low fat," or "fat free." As a guide, look on the label and choose foods with less than 3 g of fat per serving. Eat only one serving. Avoid alcohol. Do not smoke. If you need help quitting, talk with your health care provider. Eat small frequent meals instead of three large heavy meals. WHAT FOODS CAN I EAT? Grains Include healthy grains and starches such as potatoes, wheat bread, fiber-rich cereal, and brown rice. Choose whole grain options whenever possible. In adults, whole grains should account for 45-65% of your daily calories.  Fruits and Vegetables Eat plenty of fruits and vegetables. Fresh fruits and vegetables add fiber to your diet. Meats and Other Protein Sources Eat lean meat such as chicken and pork. Trim any fat off of meat before cooking it. Eggs, fish, and beans are other sources of protein. In adults, these foods should account for 10-35% of your daily calories. Dairy Choose low-fat milk and dairy options. Dairy includes fat and protein, as well as calcium.  Fats and Oils Limit high-fat foods such as fried foods, sweets, baked goods, sugary drinks.  Other Creamy sauces and condiments, such as mayonnaise, can add extra fat. Think about whether or not you need to use them, or use smaller amounts or low fat options.  WHAT FOODS ARE NOT RECOMMENDED? High fat foods, such as: [employer]. Ice cream. [place] toast. Sweet rolls. Pizza. Cheese bread. Foods covered with batter, butter, creamy sauces, or cheese. Fried foods. Sugary drinks and desserts. Foods that cause gas or bloating  Gallbladder Eating Plan High blood cholesterol, obesity, a sedentary lifestyle, an unhealthy diet, and diabetes are risk factors for developing gallstones. If you have a gallbladder condition, you may have trouble digesting fats and tolerating high fat intake. Eating a low-fat diet can help reduce your symptoms and may be  helpful before and after having surgery to remove your gallbladder (cholecystectomy). Your health care provider may recommend that you work with a dietitian to help you reduce the amount of fat in your diet. What are tips for following this plan? General guidelines Limit your fat intake to less than 30% of your total daily calories. If you eat around 1,800 calories each day, this means eating less than 60 grams (g) of fat per day. Fat is an important part of a healthy diet. Eating a low-fat diet can make it hard to maintain a healthy body weight. Ask your dietitian how much fat, calories, and other nutrients you need each day. Eat small, frequent meals throughout the day instead of three large meals. Drink at least 8-10 cups (1.9-2.4 L) of fluid a day. Drink enough fluid to keep your urine pale yellow. If you drink alcohol: Limit how much you have to: 0-1 drink a day for women who are not pregnant. 0-2 drinks a day for men. Know how much alcohol is in a drink. In the [place] one drink equals one 12 oz bottle of beer (355 mL), one 5 oz glass of wine (148 mL), or one 1 oz glass of hard liquor (44 mL). Reading food labels  Check nutrition facts on food labels for the amount of fat per serving. Choose foods with less than 3 grams of fat per serving. Shopping Choose nonfat and low-fat healthy foods. Look for the words "nonfat," "low-fat," or "fat-free." Avoid buying processed or prepackaged foods. Cooking Cook using low-fat methods, such as baking, broiling, grilling, or boiling. Cook with small amounts of healthy fats, such as olive oil, grapeseed oil, canola oil, avocado oil, or sunflower oil. What foods are recommended?  All fresh, frozen, or canned fruits and vegetables. Whole grains. Low-fat or nonfat (skim) milk and yogurt. Lean meat, skinless poultry, fish, eggs, and beans. Low-fat protein supplement powders or drinks. Spices and herbs. The items listed above may not be a complete list  of foods and beverages you can eat and drink. Contact a dietitian for more information. What foods are not recommended? High-fat foods. These include baked goods, fast food, fatty cuts of meat, ice cream, french toast, sweet rolls, pizza, cheese bread, foods covered with butter, creamy sauces, or cheese. Fried foods. These include french fries, tempura, battered fish, breaded chicken, fried breads, and sweets. Foods that cause bloating and gas. The items listed above may not be a complete list of foods that you should avoid. Contact a dietitian for more information. Summary A low-fat diet can be helpful if you have a gallbladder condition, or before and after gallbladder surgery. Limit your fat intake to less than 30% of your total daily calories. This is about 60 g of fat if you eat 1,800 calories each day. Eat small, frequent meals throughout the day instead of three large meals. This information is not intended to replace advice given to you by your health  care provider. Make sure you discuss any questions you have with your health care provider. Document Revised: 03/25/2021 Document Reviewed: 03/25/2021 Elsevier Patient Education  2024 [employer].

## 2023-02-23 DIAGNOSIS — K8010 Calculus of gallbladder with chronic cholecystitis without obstruction: Secondary | ICD-10-CM

## 2023-02-23 HISTORY — DX: Calculus of gallbladder with chronic cholecystitis without obstruction: K80.10

## 2023-03-08 ENCOUNTER — Encounter: Payer: Self-pay | Admitting: Gastroenterology

## 2023-03-08 ENCOUNTER — Ambulatory Visit: Payer: 59 | Admitting: Gastroenterology

## 2023-03-08 VITALS — BP 139/92 | HR 71 | Temp 98.4°F | Ht 71.0 in | Wt 253.0 lb

## 2023-03-08 DIAGNOSIS — K760 Fatty (change of) liver, not elsewhere classified: Secondary | ICD-10-CM

## 2023-03-08 NOTE — Progress Notes (Signed)
Gastroenterology Consultation  Referring Provider:     Jerrol Banana, MD Primary Care Physician:  Jerrol Banana, MD Primary Gastroenterologist:  Dr. Servando Snare     Reason for Consultation:     Fatty liver        HPI:   Isaiah Gonzalez is a 47 y.o. y/o male referred for consultation & management of fatty liver by Dr. Ashley Royalty, Ocie Bob, MD. This patient comes in today with a report of fatty liver.  The patient's liver enzymes have shown:  Component     Latest Ref Rng 02/10/2022 02/12/2023  Total Bilirubin     0.0 - 1.2 mg/dL 0.6  0.4   Total Bilirubin     0.0 - 1.2 mg/dL  0.4   Alkaline Phosphatase     44 - 121 IU/L 73  70   AST     0 - 40 IU/L 36  35   ALT     0 - 44 IU/L 48 (H)  54 (H)    The patient had a CT scan of the abdomen back in 2023 that showed diffuse hepatic steatosis with diverticulosis.  The patient also had diverticulosis and fatty liver seen on the CT scan back in 2017.  The patient had a recent ultrasound that showed:  IMPRESSION: Cholelithiasis without gallbladder wall thickening or pericholecystic fluid, but positive sonographic Murphy's sign is noted. If there is clinical concern for cholecystitis, HIDA scan is recommended for further evaluation.  The patient was evaluated by surgery for a cholecystectomy but had deferred any elective procedures to later in the year due to his wife needing help with her business.  The patient now reports that his gallbladder will be taken out sometime December.  He reports that his liver enzymes were normal back when he had lost significant amounts of weight when he was seeing his primary care provider and [employer].  The patient has now switched due to his doctor retiring and was given information about dieting again.  Past Medical History:  Diagnosis Date   Enlarged liver    Sleep apnea     Past Surgical History:  Procedure Laterality Date   APPENDECTOMY     INGUINAL HERNIA REPAIR     KNEE  SURGERY     lt. arm surgery      Prior to Admission medications   Medication Sig Start Date End Date Taking? Authorizing Provider  B Complex Vitamins (VITAMIN B COMPLEX PO) Take by mouth.    [provider]  buPROPion (WELLBUTRIN XL) 300 MG 24 hr tablet Take 1 tablet (300 mg total) by mouth daily. 06/02/22   Jerrol Banana, MD  lansoprazole (PREVACID) 30 MG capsule Take 1 capsule (30 mg total) by mouth daily at 12 noon. 08/15/22   Jerrol Banana, MD  PARoxetine (PAXIL-CR) 12.5 MG 24 hr tablet Take 1 tablet (12.5 mg total) by mouth daily. 05/31/22   Jerrol Banana, MD    Family History  Problem Relation Age of Onset   Heart attack Father    Heart attack Maternal Grandfather      Social History   Tobacco Use   Smoking status: Never  Vaping Use   Vaping status: Never Used  Substance Use Topics   Alcohol use: No   Drug use: No    Allergies as of 03/08/2023 - Review Complete 02/15/2023  Allergen Reaction Noted   Acetaminophen Other (See Comments) 05/02/2015    Review of Systems:  All systems reviewed and negative except where noted in HPI.   Physical Exam:  There were no vitals taken for this visit. No LMP for male patient. General:   Alert,  Well-developed, well-nourished, pleasant and cooperative in NAD Head:  Normocephalic and atraumatic. Eyes:  Sclera clear, no icterus.   Conjunctiva pink. Ears:  Normal auditory acuity. Neck:  Supple; no masses or thyromegaly. Lungs:  Respirations even and unlabored.  Clear throughout to auscultation.   No wheezes, crackles, or rhonchi. No acute distress. Heart:  Regular rate and rhythm; no murmurs, clicks, rubs, or gallops. Abdomen:  Normal bowel sounds.  No bruits.  Soft, non-tender and non-distended without masses, hepatosplenomegaly or hernias noted.  No guarding or rebound tenderness.  Negative Carnett sign.   Rectal:  Deferred.  Pulses:  Normal pulses noted. Extremities:  No clubbing or edema.  No  cyanosis. Neurologic:  Alert and oriented x3;  grossly normal neurologically. Skin:  Intact without significant lesions or rashes.  No jaundice. Lymph Nodes:  No significant cervical adenopathy. Psych:  Alert and cooperative. Normal mood and affect.  Imaging Studies: US Abdomen Limited RUQ (LIVER/GB)  Result Date: 02/12/2023 CLINICAL DATA:  Right upper quadrant abdominal tenderness. EXAM: ULTRASOUND ABDOMEN LIMITED RIGHT UPPER QUADRANT COMPARISON:  None Available. FINDINGS: Gallbladder: Cholelithiasis is noted without gallbladder wall thickening or pericholecystic fluid. Positive sonographic Murphy's sign is noted. Common bile duct: Diameter: 4 mm which is within normal limits. Liver: No focal lesion identified. Increased echogenicity of hepatic parenchyma is noted. Portal vein is patent on color Doppler imaging with normal direction of blood flow towards the liver. Other: None. IMPRESSION: Cholelithiasis without gallbladder wall thickening or pericholecystic fluid, but positive sonographic Murphy's sign is noted. If there is clinical concern for cholecystitis, HIDA scan is recommended for further evaluation. Hepatic steatosis. Electronically Signed   By: Lupita Raider M.D.   On: 02/12/2023 11:54    Assessment and Plan:   Isaiah Gonzalez is a 47 y.o. y/o male who comes in today with fatty liver.  The patient will have lab sent off to see if there is any other cause for his abnormal liver enzymes.  The patient has been told about a Mediterranean diet and about the need to look for other causes for abnormal liver enzymes.  The patient has been told that a 7% weight loss will decrease the amount of fat in his liver and may improve his liver enzymes.  The patient will try to lose weight and will be contacted with results of his labs.  The patient has been explained the plan and agrees with it.    Midge Minium, MD. Clementeen Graham    Note: This dictation was prepared with Dragon dictation along with  smaller phrase technology. Any transcriptional errors that result from this process are unintentional.

## 2023-03-08 NOTE — Patient Instructions (Addendum)
What is the Mediterranean Diet?  The Mediterranean Diet is a way of eating that emphasizes plant-based foods and healthy fats. You focus on overall eating patterns rather than following strict formulas or calculations.    In general, you'll eat:  Lots of vegetables, fruit, beans, lentils and nuts. A good amount of whole grains, like whole-wheat bread and brown rice. Plenty of extra virgin olive oil (EVOO) as a source of healthy fat. A good amount of fish, especially fish rich in omega-3 fatty acids. A moderate amount of natural cheese and yogurt. Little or no red meat, choosing poultry, fish or beans instead of red meat. Little or no sweets, sugary drinks or butter. A moderate amount of wine with meals (but if you don't already drink, don't start). This is how people ate in certain Mediterranean countries in the mid-20th century. Researchers have linked these eating patterns with a reduced risk of coronary artery disease (CAD). Today, healthcare providers recommend this eating plan if you have risk factors for heart disease or to support other aspects of your health.  A dietitian can help you modify your approach as needed based on your medical history, underlying conditions, allergies and preferences.  What are the benefits of the Mediterranean Diet? The mediterranean diet allows you to focus on overall eating patterns rather than following strict formulas or calculations. The Mediterranean Diet has many benefits, including:  Lowering your risk of cardiovascular disease, including a heart attack or stroke. Supporting a body weight that's healthy for you. Supporting healthy blood sugar levels, blood pressure and cholesterol. Lowering your risk of metabolic syndrome. Supporting a healthy balance of gut microbiota (bacteria and other microorganisms) in your digestive system. Lowering your risk for certain types of cancer. Slowing the decline of brain function as you age. Helping you live  longer. The Mediterranean Diet has these benefits because it:  Limits saturated fat and trans fat. You need some saturated fat, but only in small amounts. Eating too much saturated fat can raise your LDL (bad) cholesterol. A high LDL raises your risk of plaque buildup in your arteries (atherosclerosis). Trans fat has no health benefits. Both of these "unhealthy fats" can cause inflammation. Encourages healthy unsaturated fats, including omega-3 fatty acids. Unsaturated fats promote healthy cholesterol levels, support brain health and combat inflammation. Plus, a diet high in unsaturated fats and low in saturated fat promotes healthy blood sugar levels. Limits sodium. Eating foods high in sodium can raise your blood pressure, putting you at a greater risk for a heart attack or stroke. Limits refined carbohydrates, including sugar. Foods high in refined carbs can cause your blood sugar to spike. Refined carbs also give you excess calories without much nutritional benefit. For example, such foods often have little or no fiber. Favors foods high in fiber and antioxidants. These nutrients help reduce inflammation throughout your body. Fiber also helps keep waste moving through your large intestine and helps maintain healthy blood sugar levels. Antioxidants protect you against cancer by warding off free radicals. The Mediterranean Diet includes many different nutrients that work together to help your body. There's no single food or ingredient responsible for the Mediterranean Diet's benefits. Instead, the diet is healthy for you because of the combination of nutrients it provides.  Think of a choir with many people singing. One voice alone might carry part of the tune, but you need all the voices to come together to achieve the full effect. Similarly, the Mediterranean Diet works by giving you an ideal blend of  nutrients that harmonize to support your health.  Mediterranean Diet food list The Mediterranean  Diet encourages you to eat plenty of some foods (like whole grains and vegetables) while limiting others. If you're planning a grocery store trip, you might wonder which foods to buy. Here are some examples of foods to eat often with the Mediterranean Diet.  A Mediterranean Diet food list includes a mix of veggies, tubers, fruits, grains, nuts, seeds and legumes.

## 2023-03-09 LAB — HEPATITIS A ANTIBODY, TOTAL: hep A Total Ab: NEGATIVE

## 2023-03-09 LAB — HEPATITIS C ANTIBODY: Hep C Virus Ab: NONREACTIVE

## 2023-03-09 LAB — HEPATIC FUNCTION PANEL
ALT: 49 [IU]/L — ABNORMAL HIGH (ref 0–44)
AST: 38 [IU]/L (ref 0–40)
Albumin: 4.5 g/dL (ref 4.1–5.1)
Alkaline Phosphatase: 70 [IU]/L (ref 44–121)
Bilirubin Total: 0.5 mg/dL (ref 0.0–1.2)
Bilirubin, Direct: 0.17 mg/dL (ref 0.00–0.40)
Total Protein: 7.1 g/dL (ref 6.0–8.5)

## 2023-03-09 LAB — ANTI-SMOOTH MUSCLE ANTIBODY, IGG: Smooth Muscle Ab: 7 U (ref 0–19)

## 2023-03-09 LAB — ANA: Anti Nuclear Antibody (ANA): POSITIVE — AB

## 2023-03-09 LAB — HEPATITIS B CORE ANTIBODY, TOTAL: Hep B Core Total Ab: NEGATIVE

## 2023-03-09 LAB — HEPATITIS B SURFACE ANTIGEN: Hepatitis B Surface Ag: NEGATIVE

## 2023-03-09 LAB — MITOCHONDRIAL ANTIBODIES: Mitochondrial Ab: <20 U (ref 0.0–20.0)

## 2023-03-09 LAB — CERULOPLASMIN: Ceruloplasmin: 22.6 mg/dL (ref 16.0–31.0)

## 2023-03-09 LAB — HEPATITIS B SURFACE ANTIBODY,QUALITATIVE: Hep B Surface Ab, Qual: NONREACTIVE

## 2023-03-09 LAB — ALPHA-1-ANTITRYPSIN: A-1 Antitrypsin: 164 mg/dL (ref 101–187)

## 2023-03-13 ENCOUNTER — Ambulatory Visit: Payer: 59 | Admitting: Surgery

## 2023-03-13 ENCOUNTER — Encounter: Payer: Self-pay | Admitting: Surgery

## 2023-03-13 ENCOUNTER — Encounter: Payer: Self-pay | Admitting: Gastroenterology

## 2023-03-13 VITALS — BP 146/96 | HR 77 | Temp 98.3°F | Ht 71.0 in | Wt 251.6 lb

## 2023-03-13 DIAGNOSIS — K8010 Calculus of gallbladder with chronic cholecystitis without obstruction: Secondary | ICD-10-CM | POA: Diagnosis not present

## 2023-03-13 NOTE — Progress Notes (Signed)
Patient ID: Isaiah Gonzalez, male   DOB: [birth year], 47 y.o.   MRN: [id number]  Chief Complaint: Right upper quadrant pain  History of Present Illness F/U for pre-op planning:  he's been careful with eating, but on exam noted some tenderness.  Positioning seems to relieve some post-prandial discomfort.  Isaiah Gonzalez is a 47 y.o. male with postprandial right upper quadrant pain, increasing somewhat in frequency over the last 4 weeks.  He is obtained an ultrasound which revealed gallstones.  He reports occasional nausea, but significantly increased flatus.  He reports some irregularity of bowel activity, with more loose stools.  He currently reports a significant amount of agenda in helping his wife with her business and desires to defer elective surgery until early December if feasible.  Past Medical History Past Medical History:  Diagnosis Date   Enlarged liver    Sleep apnea       Past Surgical History:  Procedure Laterality Date   APPENDECTOMY     INGUINAL HERNIA REPAIR     KNEE SURGERY     lt. arm surgery      Allergies  Allergen Reactions   Acetaminophen Other (See Comments)    Liver complications Can't take due to having fatty liver Liver complications    Current Outpatient Medications  Medication Sig Dispense Refill   B Complex Vitamins (VITAMIN B COMPLEX PO) Take by mouth.     buPROPion (WELLBUTRIN XL) 300 MG 24 hr tablet Take 1 tablet (300 mg total) by mouth daily. 90 tablet 3   lansoprazole (PREVACID) 30 MG capsule Take 1 capsule (30 mg total) by mouth daily at 12 noon. 90 capsule 3   PARoxetine (PAXIL-CR) 12.5 MG 24 hr tablet Take 1 tablet (12.5 mg total) by mouth daily. 90 tablet 2   No current facility-administered medications for this visit.    Family History Family History  Problem Relation Age of Onset   Heart attack Father    Heart attack Maternal Grandfather       Social History Social History   Tobacco Use   Smoking status: Never   Vaping Use   Vaping status: Never Used  Substance Use Topics   Alcohol use: No   Drug use: No        Review of Systems  Constitutional: Negative.   HENT: Negative.    Eyes: Negative.   Respiratory: Negative.    Cardiovascular: Negative.   Gastrointestinal:  Positive for abdominal pain.  Genitourinary: Negative.   Skin: Negative.   Neurological: Negative.   Psychiatric/Behavioral: Negative.       Physical Exam Blood pressure (!) 146/96, pulse 77, temperature 98.3 F (36.8 C), temperature source Oral, height 5\' 11"  (1.803 m), weight 251 lb 9.6 oz (114.1 kg), SpO2 95%. Last Weight  Most recent update: 03/13/2023  8:58 AM    Weight  114.1 kg (251 lb 9.6 oz)             CONSTITUTIONAL: Well developed, and nourished, appropriately responsive and aware without distress.   EYES: Sclera non-icteric.   EARS, NOSE, MOUTH AND THROAT:  The oropharynx is clear. Oral mucosa is pink and moist.    Hearing is intact to voice.  NECK: Trachea is midline, and there is no jugular venous distension.  LYMPH NODES:  Lymph nodes in the neck are not appreciated. RESPIRATORY:  Lungs are clear, and breath sounds are equal bilaterally.  Normal respiratory effort without pathologic use of accessory muscles. CARDIOVASCULAR: Heart is regular in rate  and rhythm.   Well perfused.  GI: The abdomen is well-rounded, otherwise soft, nontender, and nondistended. He has a diastasis recti, with no evidence of ventral hernia.  There were no palpable masses.  I did not appreciate hepatosplenomegaly.  MUSCULOSKELETAL:  Symmetrical muscle tone appreciated in all four extremities.    SKIN: Skin turgor is normal. No pathologic skin lesions appreciated.  NEUROLOGIC:  Motor and sensation appear grossly normal.  Cranial nerves are grossly without defect. PSYCH:  Alert and oriented to person, place and time. Affect is appropriate for situation.  Data Reviewed I have personally reviewed what is currently available of  the patient's imaging, recent labs and medical records.   Labs:     Latest Ref Rng & Units 02/12/2023    9:28 AM 02/10/2022    8:20 AM 10/24/2016   12:44 PM  CBC  WBC 3.4 - 10.8 x10E3/uL 6.3  7.2  7.5   Hemoglobin 13.0 - 17.7 g/dL 59.7  41.6  38.4   Hematocrit 37.5 - 51.0 % 50.2  50.8  48.0   Platelets 150 - 450 x10E3/uL 204  214  185       Latest Ref Rng & Units 03/08/2023    4:01 PM 02/12/2023    9:28 AM 02/10/2022    8:20 AM  CMP  Glucose 70 - 99 mg/dL  87  90   BUN 6 - 24 mg/dL  8  8   Creatinine 5.36 - 1.27 mg/dL  4.68  0.32   Sodium 122 - 144 mmol/L  142  141   Potassium 3.5 - 5.2 mmol/L  4.2  4.0   Chloride 96 - 106 mmol/L  104  103   CO2 20 - 29 mmol/L  24  22   Calcium 8.7 - 10.2 mg/dL  9.2  9.0   Total Protein 6.0 - 8.5 g/dL 7.1  6.7  6.9   Total Bilirubin 0.0 - 1.2 mg/dL 0.5  0.4    0.4  0.6   Alkaline Phos 44 - 121 IU/L 70  70  73   AST 0 - 40 IU/L 38  35  36   ALT 0 - 44 IU/L 49  54  48      Imaging: Radiological images reviewed:  CLINICAL DATA:  Right upper quadrant abdominal tenderness.   EXAM: ULTRASOUND ABDOMEN LIMITED RIGHT UPPER QUADRANT   COMPARISON:  None Available.   FINDINGS: Gallbladder:   Cholelithiasis is noted without gallbladder wall thickening or pericholecystic fluid. Positive sonographic Murphy's sign is noted.   Common bile duct:   Diameter: 4 mm which is within normal limits.   Liver:   No focal lesion identified. Increased echogenicity of hepatic parenchyma is noted. Portal vein is patent on color Doppler imaging with normal direction of blood flow towards the liver.   Other: None.   IMPRESSION: Cholelithiasis without gallbladder wall thickening or pericholecystic fluid, but positive sonographic Murphy's sign is noted. If there is clinical concern for cholecystitis, HIDA scan is recommended for further evaluation.   Hepatic steatosis.     Electronically Signed   By: Lupita Raider M.D.   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   This was discussed thoroughly.  Optimal plan is for robotic cholecystectomy utilizing ICG imaging. Risks and benefits have been discussed with the patient which include but are not limited to anesthesia, bleeding, infection, biliary ductal injury, resulting in leak or stenosis, other associated unanticipated injuries affiliated with laparoscopic surgery.   Reviewed that removing the gallbladder will only address the symptoms related to the gallbladder itself.  I believe there is the desire to proceed, accepting the risks with understanding.  Questions elicited and answered to satisfaction.    No guarantees ever expressed or implied.    Face-to-face time spent with the patient and accompanying care providers(if present) was 40 minutes, with more than 50% of the time spent counseling, educating, and coordinating care of the patient.    These notes generated with voice recognition software. I apologize for typographical errors.  Campbell Lerner M.D., FACS 03/13/2023, 9:12 AM

## 2023-03-13 NOTE — H&P (View-Only) (Signed)
 Patient ID: Isaiah Gonzalez, male   DOB: [birth year], 47 y.o.   MRN: [id number]  Chief Complaint: Right upper quadrant pain  History of Present Illness F/U for pre-op planning:  he's been careful with eating, but on exam noted some tenderness.  Positioning seems to relieve some post-prandial discomfort.  Isaiah Gonzalez is a 47 y.o. male with postprandial right upper quadrant pain, increasing somewhat in frequency over the last 4 weeks.  He is obtained an ultrasound which revealed gallstones.  He reports occasional nausea, but significantly increased flatus.  He reports some irregularity of bowel activity, with more loose stools.  He currently reports a significant amount of agenda in helping his wife with her business and desires to defer elective surgery until early December if feasible.  Past Medical History Past Medical History:  Diagnosis Date   Enlarged liver    Sleep apnea       Past Surgical History:  Procedure Laterality Date   APPENDECTOMY     INGUINAL HERNIA REPAIR     KNEE SURGERY     lt. arm surgery      Allergies  Allergen Reactions   Acetaminophen Other (See Comments)    Liver complications Can't take due to having fatty liver Liver complications    Current Outpatient Medications  Medication Sig Dispense Refill   B Complex Vitamins (VITAMIN B COMPLEX PO) Take by mouth.     buPROPion (WELLBUTRIN XL) 300 MG 24 hr tablet Take 1 tablet (300 mg total) by mouth daily. 90 tablet 3   lansoprazole (PREVACID) 30 MG capsule Take 1 capsule (30 mg total) by mouth daily at 12 noon. 90 capsule 3   PARoxetine (PAXIL-CR) 12.5 MG 24 hr tablet Take 1 tablet (12.5 mg total) by mouth daily. 90 tablet 2   No current facility-administered medications for this visit.    Family History Family History  Problem Relation Age of Onset   Heart attack Father    Heart attack Maternal Grandfather       Social History Social History   Tobacco Use   Smoking status: Never   Vaping Use   Vaping status: Never Used  Substance Use Topics   Alcohol use: No   Drug use: No        Review of Systems  Constitutional: Negative.   HENT: Negative.    Eyes: Negative.   Respiratory: Negative.    Cardiovascular: Negative.   Gastrointestinal:  Positive for abdominal pain.  Genitourinary: Negative.   Skin: Negative.   Neurological: Negative.   Psychiatric/Behavioral: Negative.       Physical Exam Blood pressure (!) 146/96, pulse 77, temperature 98.3 F (36.8 C), temperature source Oral, height 5\' 11"  (1.803 m), weight 251 lb 9.6 oz (114.1 kg), SpO2 95%. Last Weight  Most recent update: 03/13/2023  8:58 AM    Weight  114.1 kg (251 lb 9.6 oz)             CONSTITUTIONAL: Well developed, and nourished, appropriately responsive and aware without distress.   EYES: Sclera non-icteric.   EARS, NOSE, MOUTH AND THROAT:  The oropharynx is clear. Oral mucosa is pink and moist.    Hearing is intact to voice.  NECK: Trachea is midline, and there is no jugular venous distension.  LYMPH NODES:  Lymph nodes in the neck are not appreciated. RESPIRATORY:  Lungs are clear, and breath sounds are equal bilaterally.  Normal respiratory effort without pathologic use of accessory muscles. CARDIOVASCULAR: Heart is regular in rate  and rhythm.   Well perfused.  GI: The abdomen is well-rounded, otherwise soft, nontender, and nondistended. He has a diastasis recti, with no evidence of ventral hernia.  There were no palpable masses.  I did not appreciate hepatosplenomegaly.  MUSCULOSKELETAL:  Symmetrical muscle tone appreciated in all four extremities.    SKIN: Skin turgor is normal. No pathologic skin lesions appreciated.  NEUROLOGIC:  Motor and sensation appear grossly normal.  Cranial nerves are grossly without defect. PSYCH:  Alert and oriented to person, place and time. Affect is appropriate for situation.  Data Reviewed I have personally reviewed what is currently available of  the patient's imaging, recent labs and medical records.   Labs:     Latest Ref Rng & Units 02/12/2023    9:28 AM 02/10/2022    8:20 AM 10/24/2016   12:44 PM  CBC  WBC 3.4 - 10.8 x10E3/uL 6.3  7.2  7.5   Hemoglobin 13.0 - 17.7 g/dL 59.7  41.6  38.4   Hematocrit 37.5 - 51.0 % 50.2  50.8  48.0   Platelets 150 - 450 x10E3/uL 204  214  185       Latest Ref Rng & Units 03/08/2023    4:01 PM 02/12/2023    9:28 AM 02/10/2022    8:20 AM  CMP  Glucose 70 - 99 mg/dL  87  90   BUN 6 - 24 mg/dL  8  8   Creatinine 5.36 - 1.27 mg/dL  4.68  0.32   Sodium 122 - 144 mmol/L  142  141   Potassium 3.5 - 5.2 mmol/L  4.2  4.0   Chloride 96 - 106 mmol/L  104  103   CO2 20 - 29 mmol/L  24  22   Calcium 8.7 - 10.2 mg/dL  9.2  9.0   Total Protein 6.0 - 8.5 g/dL 7.1  6.7  6.9   Total Bilirubin 0.0 - 1.2 mg/dL 0.5  0.4    0.4  0.6   Alkaline Phos 44 - 121 IU/L 70  70  73   AST 0 - 40 IU/L 38  35  36   ALT 0 - 44 IU/L 49  54  48      Imaging: Radiological images reviewed:  CLINICAL DATA:  Right upper quadrant abdominal tenderness.   EXAM: ULTRASOUND ABDOMEN LIMITED RIGHT UPPER QUADRANT   COMPARISON:  None Available.   FINDINGS: Gallbladder:   Cholelithiasis is noted without gallbladder wall thickening or pericholecystic fluid. Positive sonographic Murphy's sign is noted.   Common bile duct:   Diameter: 4 mm which is within normal limits.   Liver:   No focal lesion identified. Increased echogenicity of hepatic parenchyma is noted. Portal vein is patent on color Doppler imaging with normal direction of blood flow towards the liver.   Other: None.   IMPRESSION: Cholelithiasis without gallbladder wall thickening or pericholecystic fluid, but positive sonographic Murphy's sign is noted. If there is clinical concern for cholecystitis, HIDA scan is recommended for further evaluation.   Hepatic steatosis.     Electronically Signed   By: Lupita Raider M.D.   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   This was discussed thoroughly.  Optimal plan is for robotic cholecystectomy utilizing ICG imaging. Risks and benefits have been discussed with the patient which include but are not limited to anesthesia, bleeding, infection, biliary ductal injury, resulting in leak or stenosis, other associated unanticipated injuries affiliated with laparoscopic surgery.   Reviewed that removing the gallbladder will only address the symptoms related to the gallbladder itself.  I believe there is the desire to proceed, accepting the risks with understanding.  Questions elicited and answered to satisfaction.    No guarantees ever expressed or implied.    Face-to-face time spent with the patient and accompanying care providers(if present) was 40 minutes, with more than 50% of the time spent counseling, educating, and coordinating care of the patient.    These notes generated with voice recognition software. I apologize for typographical errors.  Campbell Lerner M.D., FACS 03/13/2023, 9:12 AM

## 2023-03-13 NOTE — Patient Instructions (Signed)
You have requested to have your gallbladder removed. This will be done at [facility] with Dr. Rodenberg.  You will most likely be out of work 1-2 weeks for this surgery.  If you have FMLA or disability paperwork that needs filled out you may drop this off at our office or this can be faxed to [phone].  You will return after your post-op appointment with a lifting restriction for approximately 4 more weeks.  You will be able to eat anything you would like to following surgery. But, start by eating a bland diet and advance this as tolerated. The Gallbladder diet is below, please go as closely by this diet as possible prior to surgery to avoid any further attacks.  Please see the (blue)pre-care form that you have been given today. Our surgery scheduler will call you to verify surgery date and to go over information.   If you have any questions, please call our office.  Laparoscopic Cholecystectomy Laparoscopic cholecystectomy is surgery to remove the gallbladder. The gallbladder is located in the upper right part of the abdomen, behind the liver. It is a storage sac for bile, which is produced in the liver. Bile aids in the digestion and absorption of fats. Cholecystectomy is often done for inflammation of the gallbladder (cholecystitis). This condition is usually caused by a buildup of gallstones (cholelithiasis) in the gallbladder. Gallstones can block the flow of bile, and that can result in inflammation and pain. In severe cases, emergency surgery may be required. If emergency surgery is not required, you will have time to prepare for the procedure. Laparoscopic surgery is an alternative to open surgery. Laparoscopic surgery has a shorter recovery time. Your common bile duct may also need to be examined during the procedure. If stones are found in the common bile duct, they may be removed. LET YOUR HEALTH CARE PROVIDER KNOW ABOUT: Any allergies you have. All medicines you are taking,  including vitamins, herbs, eye drops, creams, and over-the-counter medicines. Previous problems you or members of your family have had with the use of anesthetics. Any blood disorders you have. Previous surgeries you have had.  Any medical conditions you have. RISKS AND COMPLICATIONS Generally, this is a safe procedure. However, problems may occur, including: Infection. Bleeding. Allergic reactions to medicines. Damage to other structures or organs. A stone remaining in the common bile duct. A bile leak from the cyst duct that is clipped when your gallbladder is removed. The need to convert to open surgery, which requires a larger incision in the abdomen. This may be necessary if your surgeon thinks that it is not safe to continue with a laparoscopic procedure. BEFORE THE PROCEDURE Ask your health care provider about: Changing or stopping your regular medicines. This is especially important if you are taking diabetes medicines or blood thinners. Taking medicines such as aspirin and ibuprofen. These medicines can thin your blood. Do not take these medicines before your procedure if your health care provider instructs you not to. Follow instructions from your health care provider about eating or drinking restrictions. Let your health care provider know if you develop a cold or an infection before surgery. Plan to have someone take you home after the procedure. Ask your health care provider how your surgical site will be marked or identified. You may be given antibiotic medicine to help prevent infection. PROCEDURE To reduce your risk of infection: Your health care team will wash or sanitize their hands. Your skin will be washed with soap. An IV   tube may be inserted into one of your veins. You will be given a medicine to make you fall asleep (general anesthetic). A breathing tube will be placed in your mouth. The surgeon will make several small cuts (incisions) in your abdomen. A thin,  lighted tube (laparoscope) that has a tiny camera on the end will be inserted through one of the small incisions. The camera on the laparoscope will send a picture to a TV screen (monitor) in the operating room. This will give the surgeon a good view inside your abdomen. A gas will be pumped into your abdomen. This will expand your abdomen to give the surgeon more room to perform the surgery. Other tools that are needed for the procedure will be inserted through the other incisions. The gallbladder will be removed through one of the incisions. After your gallbladder has been removed, the incisions will be closed with stitches (sutures), staples, or skin glue. Your incisions may be covered with a bandage (dressing). The procedure may vary among health care providers and hospitals. AFTER THE PROCEDURE Your blood pressure, heart rate, breathing rate, and blood oxygen level will be monitored often until the medicines you were given have worn off. You will be given medicines as needed to control your pain.   This information is not intended to replace advice given to you by your health care provider. Make sure you discuss any questions you have with your health care provider.   Document Released: 04/10/2005 Document Revised: 12/30/2014 Document Reviewed: 11/20/2012 Elsevier Interactive Patient Education 2016 [employer]   Low-Fat Diet for Gallbladder Conditions A low-fat diet can be helpful if you have pancreatitis or a gallbladder condition. With these conditions, your pancreas and gallbladder have trouble digesting fats. A healthy eating plan with less fat will help rest your pancreas and gallbladder and reduce your symptoms. WHAT DO I NEED TO KNOW ABOUT THIS DIET? Eat a low-fat diet. Reduce your fat intake to less than 20-30% of your total daily calories. This is less than 50-60 g of fat per day. Remember that you need some fat in your diet. Ask your dietician what your daily goal should  be. Choose nonfat and low-fat healthy foods. Look for the words "nonfat," "low fat," or "fat free." As a guide, look on the label and choose foods with less than 3 g of fat per serving. Eat only one serving. Avoid alcohol. Do not smoke. If you need help quitting, talk with your health care provider. Eat small frequent meals instead of three large heavy meals. WHAT FOODS CAN I EAT? Grains Include healthy grains and starches such as potatoes, wheat bread, fiber-rich cereal, and brown rice. Choose whole grain options whenever possible. In adults, whole grains should account for 45-65% of your daily calories.  Fruits and Vegetables Eat plenty of fruits and vegetables. Fresh fruits and vegetables add fiber to your diet. Meats and Other Protein Sources Eat lean meat such as chicken and pork. Trim any fat off of meat before cooking it. Eggs, fish, and beans are other sources of protein. In adults, these foods should account for 10-35% of your daily calories. Dairy Choose low-fat milk and dairy options. Dairy includes fat and protein, as well as calcium.  Fats and Oils Limit high-fat foods such as fried foods, sweets, baked goods, sugary drinks.  Other Creamy sauces and condiments, such as mayonnaise, can add extra fat. Think about whether or not you need to use them, or use smaller amounts or low fat options.   WHAT FOODS ARE NOT RECOMMENDED? High fat foods, such as: Baked goods. Ice cream. French toast. Sweet rolls. Pizza. Cheese bread. Foods covered with batter, butter, creamy sauces, or cheese. Fried foods. Sugary drinks and desserts. Foods that cause gas or bloating   This information is not intended to replace advice given to you by your health care provider. Make sure you discuss any questions you have with your health care provider.   Document Released: 04/15/2013 Document Reviewed: 04/15/2013 Elsevier Interactive Patient Education 2016 [employer]   

## 2023-03-21 ENCOUNTER — Other Ambulatory Visit: Payer: Self-pay

## 2023-03-21 ENCOUNTER — Encounter
Admission: RE | Admit: 2023-03-21 | Discharge: 2023-03-21 | Disposition: A | Discharge status: Home / Self Care | Payer: 59 | Source: Ambulatory Visit | Attending: Surgery | Admitting: Surgery

## 2023-03-21 HISTORY — DX: Restlessness and agitation: R45.1

## 2023-03-21 HISTORY — DX: Anxiety disorder, unspecified: F41.9

## 2023-03-21 NOTE — Patient Instructions (Addendum)
Your procedure is scheduled on: Friday, December 6 Report to the Registration Desk on the 1st floor of the [facility] To find out your arrival time, please call [phone] between 1PM - 3PM on: Thursday, December 5 If your arrival time is 6:00 am, do not arrive before that time as the [facility] entrance doors do not open until 6:00 am.  REMEMBER: Instructions that are not followed completely may result in serious medical risk, up to and including death; or upon the discretion of your surgeon and anesthesiologist your surgery may need to be rescheduled.  Do not eat or drink after midnight the night before surgery.  No gum chewing or hard candies.  One week prior to surgery: starting November 29 Stop Anti-inflammatories (NSAIDS) such as Advil, Aleve, Ibuprofen, Motrin, Naproxen, Naprosyn and Aspirin based products such as Excedrin, Goody's Powder, BC Powder. Stop ANY OVER THE COUNTER supplements until after surgery. Stop turmeric.  You may however, continue to take Tylenol if needed for pain up until the day of surgery.  Continue taking all of your other prescription medications up until the day of surgery.  DO NOT TAKE ANY MEDICATIONS ON THE MORNING OF SURGERY.  No Alcohol for 24 hours before or after surgery.  No Smoking including e-cigarettes for 24 hours before surgery.  No chewable tobacco products for at least 6 hours before surgery.  No nicotine patches on the day of surgery.  Do not use any "recreational" drugs for at least a week (preferably 2 weeks) before your surgery.  Please be advised that the combination of cocaine and anesthesia may have negative outcomes, up to and including death. If you test positive for cocaine, your surgery will be cancelled.  On the morning of surgery brush your teeth with toothpaste and water, you may rinse your mouth with mouthwash if you wish. Do not swallow any toothpaste or mouthwash.  Use CHG Soap as directed on instruction  sheet.  Do not wear jewelry, make-up, hairpins, clips or nail polish.  For welded (permanent) jewelry: bracelets, anklets, waist bands, etc.  Please have this removed prior to surgery.  If it is not removed, there is a chance that hospital personnel will need to cut it off on the day of surgery.  Do not wear lotions, powders, or perfumes.   Do not shave body hair from the neck down 48 hours before surgery.  Contact lenses, hearing aids and dentures may not be worn into surgery.  Do not bring valuables to the hospital. [facility] is not responsible for any missing/lost belongings or valuables.   Bring your C-PAP to the hospital in case you may have to spend the night.   Notify your doctor if there is any change in your medical condition (cold, fever, infection).  Wear comfortable clothing (specific to your surgery type) to the hospital.  After surgery, you can help prevent lung complications by doing breathing exercises.  Take deep breaths and cough every 1-2 hours. Your doctor may order a device called an Incentive Spirometer to help you take deep breaths. When coughing or sneezing, hold a pillow firmly against your incision with both hands. This is called "splinting." Doing this helps protect your incision. It also decreases belly discomfort.  If you are being discharged the day of surgery, you will not be allowed to drive home. You will need a responsible individual to drive you home and stay with you for 24 hours after surgery.   If you are taking public transportation,  you will need to have a responsible individual with you.  Please call the Pre-admissions Testing Dept. at [phone] if you have any questions about these instructions.  Surgery Visitation Policy:  Patients having surgery or a procedure may have two visitors.  Children under the age of 44 must have an adult with them who is not the patient.      Preparing for Surgery with CHLORHEXIDINE GLUCONATE (CHG)  Soap  Chlorhexidine Gluconate (CHG) Soap  o An antiseptic cleaner that kills germs and bonds with the skin to continue killing germs even after washing  o Used for showering the night before surgery and morning of surgery  Before surgery, you can play an important role by reducing the number of germs on your skin.  CHG (Chlorhexidine gluconate) soap is an antiseptic cleanser which kills germs and bonds with the skin to continue killing germs even after washing.  Please do not use if you have an allergy to CHG or antibacterial soaps. If your skin becomes reddened/irritated stop using the CHG.  1. Shower the NIGHT BEFORE SURGERY and the MORNING OF SURGERY with CHG soap.  2. If you choose to wash your hair, wash your hair first as usual with your normal shampoo.  3. After shampooing, rinse your hair and body thoroughly to remove the shampoo.  4. Use CHG as you would any other liquid soap. You can apply CHG directly to the skin and wash gently with a scrungie or a clean washcloth.  5. Apply the CHG soap to your body only from the neck down. Do not use on open wounds or open sores. Avoid contact with your eyes, ears, mouth, and genitals (private parts). Wash face and genitals (private parts) with your normal soap.  6. Wash thoroughly, paying special attention to the area where your surgery will be performed.  7. Thoroughly rinse your body with warm water.  8. Do not shower/wash with your normal soap after using and rinsing off the CHG soap.  9. Pat yourself dry with a clean towel.  10. Wear clean pajamas to bed the night before surgery.  12. Place clean sheets on your bed the night of your first shower and do not sleep with pets.  13. Shower again with the CHG soap on the day of surgery prior to arriving at the hospital.  14. Do not apply any deodorants/lotions/powders.  15. Please wear clean clothes to the hospital.

## 2023-03-28 ENCOUNTER — Telehealth: Payer: Self-pay | Admitting: *Deleted

## 2023-03-28 NOTE — Telephone Encounter (Signed)
Faxed FMLA to [employer] at [phone]

## 2023-03-29 MED ORDER — CHLORHEXIDINE GLUCONATE CLOTH 2 % EX PADS: 6.0000 | MEDICATED_PAD | Freq: Once | CUTANEOUS | Status: DC

## 2023-03-29 MED ORDER — BUPIVACAINE LIPOSOME 1.3 % IJ SUSP: 20.0000 mL | Freq: Once | INTRAMUSCULAR | Status: DC

## 2023-03-29 MED ORDER — CELECOXIB 200 MG PO CAPS
200.0000 mg | ORAL_CAPSULE | ORAL | Status: AC
  Administered 2023-03-30: 200 mg via ORAL

## 2023-03-29 MED ORDER — ORAL CARE MOUTH RINSE: 15.0000 mL | Freq: Once | OROMUCOSAL | Status: AC

## 2023-03-29 MED ORDER — INDOCYANINE GREEN 25 MG IV SOLR
1.2500 mg | Freq: Once | INTRAVENOUS | Status: AC
  Administered 2023-03-30: 1.25 mg via INTRAVENOUS
  Filled 2023-03-29: qty 0.5

## 2023-03-29 MED ORDER — GABAPENTIN 300 MG PO CAPS
300.0000 mg | ORAL_CAPSULE | ORAL | Status: AC
  Administered 2023-03-30: 300 mg via ORAL

## 2023-03-29 MED ORDER — CHLORHEXIDINE GLUCONATE CLOTH 2 % EX PADS
6.0000 | MEDICATED_PAD | Freq: Once | CUTANEOUS | Status: AC
  Administered 2023-03-30: 6 via TOPICAL

## 2023-03-29 MED ORDER — CHLORHEXIDINE GLUCONATE 0.12 % MT SOLN
15.0000 mL | Freq: Once | OROMUCOSAL | Status: AC
  Administered 2023-03-30: 15 mL via OROMUCOSAL

## 2023-03-29 MED ORDER — LACTATED RINGERS IV SOLN: INTRAVENOUS | Status: DC

## 2023-03-29 MED ORDER — CEFAZOLIN SODIUM-DEXTROSE 2-4 GM/100ML-% IV SOLN
2.0000 g | INTRAVENOUS | Status: AC
  Administered 2023-03-30: 2 g via INTRAVENOUS

## 2023-03-30 ENCOUNTER — Ambulatory Visit
Admission: RE | Admit: 2023-03-30 | Discharge: 2023-03-30 | Disposition: A | Discharge status: Home / Self Care | Payer: 59 | Attending: Surgery | Admitting: Surgery

## 2023-03-30 ENCOUNTER — Ambulatory Visit: Payer: 59 | Admitting: General Practice

## 2023-03-30 ENCOUNTER — Encounter: Payer: Self-pay | Admitting: Surgery

## 2023-03-30 ENCOUNTER — Encounter
Admission: RE | Disposition: A | Discharge status: Home / Self Care | Payer: Self-pay | Source: Home / Self Care | Attending: Surgery

## 2023-03-30 ENCOUNTER — Other Ambulatory Visit: Payer: Self-pay

## 2023-03-30 DIAGNOSIS — K219 Gastro-esophageal reflux disease without esophagitis: Secondary | ICD-10-CM | POA: Diagnosis not present

## 2023-03-30 DIAGNOSIS — K8010 Calculus of gallbladder with chronic cholecystitis without obstruction: Secondary | ICD-10-CM | POA: Insufficient documentation

## 2023-03-30 DIAGNOSIS — R079 Chest pain, unspecified: Secondary | ICD-10-CM | POA: Diagnosis not present

## 2023-03-30 DIAGNOSIS — F419 Anxiety disorder, unspecified: Secondary | ICD-10-CM | POA: Diagnosis not present

## 2023-03-30 SURGERY — CHOLECYSTECTOMY, ROBOT-ASSISTED, LAPAROSCOPIC
Anesthesia: General

## 2023-03-30 MED ORDER — FENTANYL CITRATE (PF) 100 MCG/2ML IJ SOLN
25.0000 ug | INTRAMUSCULAR | Status: AC | PRN
  Administered 2023-03-30 (×6): 25 ug via INTRAVENOUS

## 2023-03-30 MED ORDER — ROCURONIUM BROMIDE 10 MG/ML (PF) SYRINGE
PREFILLED_SYRINGE | INTRAVENOUS | Status: DC | PRN
  Administered 2023-03-30: 80 mg via INTRAVENOUS
  Administered 2023-03-30: 20 mg via INTRAVENOUS

## 2023-03-30 MED ORDER — PHENYLEPHRINE 80 MCG/ML (10ML) SYRINGE FOR IV PUSH (FOR BLOOD PRESSURE SUPPORT)
PREFILLED_SYRINGE | INTRAVENOUS | Status: DC | PRN
  Administered 2023-03-30: 80 ug via INTRAVENOUS

## 2023-03-30 MED ORDER — FENTANYL CITRATE (PF) 100 MCG/2ML IJ SOLN
INTRAMUSCULAR | Status: AC
  Filled 2023-03-30: qty 2

## 2023-03-30 MED ORDER — FENTANYL CITRATE (PF) 100 MCG/2ML IJ SOLN
INTRAMUSCULAR | Status: DC | PRN
  Administered 2023-03-30 (×4): 50 ug via INTRAVENOUS

## 2023-03-30 MED ORDER — GABAPENTIN 300 MG PO CAPS
ORAL_CAPSULE | ORAL | Status: AC
  Filled 2023-03-30: qty 1

## 2023-03-30 MED ORDER — ACETAMINOPHEN 500 MG PO TABS
ORAL_TABLET | ORAL | Status: AC
  Filled 2023-03-30: qty 2

## 2023-03-30 MED ORDER — ACETAMINOPHEN 500 MG PO TABS: 1000.0000 mg | ORAL_TABLET | ORAL | Status: DC

## 2023-03-30 MED ORDER — DEXAMETHASONE SODIUM PHOSPHATE 10 MG/ML IJ SOLN
INTRAMUSCULAR | Status: DC | PRN
  Administered 2023-03-30: 10 mg via INTRAVENOUS

## 2023-03-30 MED ORDER — PROPOFOL 10 MG/ML IV BOLUS
INTRAVENOUS | Status: AC
  Filled 2023-03-30: qty 40

## 2023-03-30 MED ORDER — BUPIVACAINE-EPINEPHRINE (PF) 0.25% -1:200000 IJ SOLN
INTRAMUSCULAR | Status: AC
  Filled 2023-03-30: qty 30

## 2023-03-30 MED ORDER — PROPOFOL 10 MG/ML IV BOLUS
INTRAVENOUS | Status: DC | PRN
  Administered 2023-03-30: 200 mg via INTRAVENOUS

## 2023-03-30 MED ORDER — OXYCODONE HCL 5 MG PO TABS: 5.0000 mg | ORAL_TABLET | Freq: Four times a day (QID) | ORAL | 0 refills | Status: DC | PRN

## 2023-03-30 MED ORDER — LIDOCAINE HCL (PF) 2 % IJ SOLN
INTRAMUSCULAR | Status: DC | PRN
  Administered 2023-03-30: 100 mg via INTRADERMAL

## 2023-03-30 MED ORDER — SEVOFLURANE IN SOLN
RESPIRATORY_TRACT | Status: AC
  Filled 2023-03-30: qty 250

## 2023-03-30 MED ORDER — OXYCODONE HCL 5 MG PO TABS
ORAL_TABLET | ORAL | Status: AC
  Filled 2023-03-30: qty 1

## 2023-03-30 MED ORDER — DEXMEDETOMIDINE HCL IN NACL 80 MCG/20ML IV SOLN
INTRAVENOUS | Status: DC | PRN
  Administered 2023-03-30 (×2): 8 ug via INTRAVENOUS

## 2023-03-30 MED ORDER — CHLORHEXIDINE GLUCONATE 0.12 % MT SOLN
OROMUCOSAL | Status: AC
  Filled 2023-03-30: qty 15

## 2023-03-30 MED ORDER — SUGAMMADEX SODIUM 200 MG/2ML IV SOLN
INTRAVENOUS | Status: DC | PRN
  Administered 2023-03-30: 200 mg via INTRAVENOUS

## 2023-03-30 MED ORDER — CELECOXIB 200 MG PO CAPS
ORAL_CAPSULE | ORAL | Status: AC
  Filled 2023-03-30: qty 1

## 2023-03-30 MED ORDER — MIDAZOLAM HCL 2 MG/2ML IJ SOLN
INTRAMUSCULAR | Status: AC
Start: 2023-03-30 — End: ?
  Filled 2023-03-30: qty 2

## 2023-03-30 MED ORDER — MIDAZOLAM HCL 2 MG/2ML IJ SOLN
INTRAMUSCULAR | Status: AC
  Filled 2023-03-30: qty 2

## 2023-03-30 MED ORDER — OXYCODONE HCL 5 MG/5ML PO SOLN: 5.0000 mg | Freq: Once | ORAL | Status: AC | PRN

## 2023-03-30 MED ORDER — IBUPROFEN 800 MG PO TABS: 800.0000 mg | ORAL_TABLET | Freq: Three times a day (TID) | ORAL | 0 refills | Status: AC | PRN

## 2023-03-30 MED ORDER — CEFAZOLIN SODIUM-DEXTROSE 2-4 GM/100ML-% IV SOLN
INTRAVENOUS | Status: AC
  Filled 2023-03-30: qty 100

## 2023-03-30 MED ORDER — BUPIVACAINE-EPINEPHRINE (PF) 0.25% -1:200000 IJ SOLN
INTRAMUSCULAR | Status: DC | PRN
  Administered 2023-03-30: 30 mL

## 2023-03-30 MED ORDER — OXYCODONE HCL 5 MG PO TABS
5.0000 mg | ORAL_TABLET | Freq: Once | ORAL | Status: AC | PRN
  Administered 2023-03-30: 5 mg via ORAL

## 2023-03-30 MED ORDER — MIDAZOLAM HCL 2 MG/2ML IJ SOLN
INTRAMUSCULAR | Status: DC | PRN
  Administered 2023-03-30: 2 mg via INTRAVENOUS

## 2023-03-30 MED ORDER — 0.9 % SODIUM CHLORIDE (POUR BTL) OPTIME
TOPICAL | Status: DC | PRN
  Administered 2023-03-30: 500 mL

## 2023-03-30 MED ORDER — ONDANSETRON HCL 4 MG/2ML IJ SOLN
INTRAMUSCULAR | Status: DC | PRN
  Administered 2023-03-30: 4 mg via INTRAVENOUS

## 2023-03-30 SURGICAL SUPPLY — 41 items
BAG PRESSURE INF REUSE 3000 (BAG) IMPLANT
CLIP LIGATING HEM O LOK PURPLE (MISCELLANEOUS) ×1 IMPLANT
COVER TIP SHEARS 8 DVNC (MISCELLANEOUS) ×1 IMPLANT
DERMABOND ADVANCED .7 DNX12 (GAUZE/BANDAGES/DRESSINGS) ×1 IMPLANT
DRAPE ARM DVNC X/XI (DISPOSABLE) ×4 IMPLANT
DRAPE COLUMN DVNC XI (DISPOSABLE) ×1 IMPLANT
FORCEPS BPLR R/ABLATION 8 DVNC (INSTRUMENTS) ×1 IMPLANT
FORCEPS PROGRASP DVNC XI (FORCEP) ×1 IMPLANT
GLOVE ORTHO TXT STRL SZ7.5 (GLOVE) ×2 IMPLANT
GOWN STRL REUS W/ TWL LRG LVL3 (GOWN DISPOSABLE) ×2 IMPLANT
GOWN STRL REUS W/ TWL XL LVL3 (GOWN DISPOSABLE) ×2 IMPLANT
GRASPER SUT TROCAR 14GX15 (MISCELLANEOUS) ×1 IMPLANT
IRRIGATION STRYKERFLOW (MISCELLANEOUS) IMPLANT
IRRIGATOR STRYKERFLOW (MISCELLANEOUS)
IRRIGATOR SUCT 8 DISP DVNC XI (IRRIGATION / IRRIGATOR) IMPLANT
IV NS IRRIG 3000ML ARTHROMATIC (IV SOLUTION) IMPLANT
KIT PINK PAD W/HEAD ARE REST (MISCELLANEOUS) ×1
KIT PINK PAD W/HEAD ARM REST (MISCELLANEOUS) ×1 IMPLANT
KIT TURNOVER KIT A (KITS) ×1 IMPLANT
LABEL OR SOLS (LABEL) ×1 IMPLANT
MANIFOLD NEPTUNE II (INSTRUMENTS) ×1 IMPLANT
NDL HYPO 22X1.5 SAFETY MO (MISCELLANEOUS) ×1 IMPLANT
NDL INSUFFLATION 14GA 120MM (NEEDLE) ×1 IMPLANT
NEEDLE HYPO 22X1.5 SAFETY MO (MISCELLANEOUS) ×1
NEEDLE INSUFFLATION 14GA 120MM (NEEDLE) ×1
NS IRRIG 500ML POUR BTL (IV SOLUTION) ×1 IMPLANT
PACK LAP CHOLECYSTECTOMY (MISCELLANEOUS) ×1 IMPLANT
SCISSORS MNPLR CVD DVNC XI (INSTRUMENTS) ×1 IMPLANT
SEAL UNIV 5-12 XI (MISCELLANEOUS) ×4 IMPLANT
SET TUBE SMOKE EVAC HIGH FLOW (TUBING) ×1 IMPLANT
SOL ELECTROSURG ANTI STICK (MISCELLANEOUS) ×1
SOLUTION ELECTROSURG ANTI STCK (MISCELLANEOUS) ×1 IMPLANT
SPIKE FLUID TRANSFER (MISCELLANEOUS) ×1 IMPLANT
SUT MNCRL 4-0 27XMFL (SUTURE) ×1
SUT VICRYL 0 UR6 27IN ABS (SUTURE) ×1 IMPLANT
SUTURE MNCRL 4-0 27XMF (SUTURE) ×1 IMPLANT
SYS BAG RETRIEVAL 10MM (BASKET) ×1
SYSTEM BAG RETRIEVAL 10MM (BASKET) ×1 IMPLANT
TRAP FLUID SMOKE EVACUATOR (MISCELLANEOUS) ×1 IMPLANT
TROCAR Z-THREAD FIOS 11X100 BL (TROCAR) ×1 IMPLANT
WATER STERILE IRR 500ML POUR (IV SOLUTION) ×1 IMPLANT

## 2023-03-30 NOTE — Consult Note (Signed)
[facility] CARDIOLOGY CONSULT NOTE       Patient ID: Isaiah Gonzalez MRN: [id number] DOB/AGE: 1975/06/16 47 y.o.  Admit date: 03/30/2023 Referring Physician Dr Claudine Mouton Primary Physician Ashley Royalty, Ocie Bob, MD Primary Cardiologist none Reason for Consultation post op chest pain  HPI: Isaiah Gonzalez is a 47 y.o. male  with a past medical history of OSA, fatty liver, and cholecystitis who is POD #0 on 03/30/2023 s/p laparoscopic cholecystectomy. Cardiology was consulted for post op chest pain. Patient has normal EKG without evidence of acute or prior infarct. His vitals are stable on telemetry without any dynamic ST or T wave changes. His blood pressure is well controlled and he does not have any cardiac history. Patient likely experiencing pain due to gas and post op state. He is welcome to follow up with me in office early next week. Denies shortness of breath, palpitations, diaphoresis, syncope, edema, PND, orthopnea.   Review of systems complete and found to be negative unless listed above     Past Medical History:  Diagnosis Date   Anxiety with agitation    Appendicitis 2013   Chronic cholecystitis with calculus 02/2023   Enlarged liver    Left inguinal hernia 1990   Lumbar transverse process fracture (HCC) 03/2013   right L1 - L2 s/p crush by tree   MRSA (methicillin resistant Staphylococcus aureus) infection 08/02/2012   left arm abscess (? spider bite)   Multiple rib fractures 03/2013   right 5-11; s/p crush by tree   Sleep apnea    inconsistant use of C-Pap   Trauma 03/2013   crushed right side from falling tree    Past Surgical History:  Procedure Laterality Date   APPENDECTOMY  04/09/2012   INCISION AND DRAINAGE ABSCESS Left 08/02/2012   arm (? spider bite) +MRSA   INGUINAL HERNIA REPAIR Left 1990   KNEE ARTHROSCOPY Bilateral    1990's    Medications Prior to Admission  Medication Sig Dispense Refill Last Dose   buPROPion (WELLBUTRIN XL) 300  MG 24 hr tablet Take 1 tablet (300 mg total) by mouth daily. (Patient taking differently: Take 300 mg by mouth at bedtime.) 90 tablet 3 03/28/2023   Cyanocobalamin (B-12 PO) Take 1,000 mcg by mouth at bedtime.   03/28/2023   pantoprazole (PROTONIX) 40 MG tablet Take 40 mg by mouth at bedtime.   03/28/2023   PARoxetine (PAXIL-CR) 12.5 MG 24 hr tablet Take 1 tablet (12.5 mg total) by mouth daily. (Patient taking differently: Take 12.5 mg by mouth at bedtime.) 90 tablet 2 03/28/2023   TURMERIC-GINGER PO Take 2 capsules by mouth at bedtime.   Past Week   Social History   Socioeconomic History   Marital status: Married    Spouse name: Brandi   Number of children: 1   Years of education: Not on file   Highest education level: Not on file  Occupational History   Not on file  Tobacco Use   Smoking status: Never   Smokeless tobacco: Never  Vaping Use   Vaping status: Never Used  Substance and Sexual Activity   Alcohol use: No   Drug use: No   Sexual activity: Yes  Other Topics Concern   Not on file  Social History Narrative   Not on file   Social Determinants of Health   Financial Resource Strain: Low Risk  (05/31/2022)   Overall Financial Resource Strain (CARDIA)    Difficulty of Paying Living Expenses: Not hard at all  Food  Insecurity: No Food Insecurity (05/31/2022)   Hunger Vital Sign    Worried About Running Out of Food in the Last Year: Never true    Ran Out of Food in the Last Year: Never true  Transportation Needs: No Transportation Needs (05/31/2022)   PRAPARE - Administrator, Civil Service (Medical): No    Lack of Transportation (Non-Medical): No  Physical Activity: Not on file  Stress: Not on file  Social Connections: Not on file  Intimate Partner Violence: Not At Risk (05/31/2022)   Humiliation, Afraid, Rape, and Kick questionnaire    Fear of Current or Ex-Partner: No    Emotionally Abused: No    Physically Abused: No    Sexually Abused: No    Family History   Problem Relation Age of Onset   Heart attack Father    Heart attack Maternal Grandfather      Vitals:   03/30/23 0843 03/30/23 1305 03/30/23 1315 03/30/23 1345  BP: (!) 143/75 (!) 137/91 (!) 126/95 128/83  Pulse: 88 77 81 77  Resp: 18 20 18 10   Temp: (!) 96.2 F (35.7 C) (!) 97 F (36.1 C)    TempSrc: Tympanic     SpO2: 99% 94% 92% 95%  Weight: 114.8 kg     Height: 5\' 11"  (1.803 m)       PHYSICAL EXAM General: awake, well nourished, in no acute distress. HEENT: Normocephalic and atraumatic. Neck: No JVD.  Lungs: Normal respiratory effort. Clear bilaterally to auscultation. No wheezes, crackles, rhonchi.  Heart: HRRR. Normal S1 and S2 without gallops or murmurs.  Abdomen: Non-distended appearing.  Msk: Normal strength and tone for age. Extremities: Warm and well perfused. No clubbing, cyanosis. no edema.  Neuro: Alert and oriented X 3. Psych: Answers questions appropriately.   Labs: Basic Metabolic Panel: No results for input(s): "NA", "K", "CL", "CO2", "GLUCOSE", "BUN", "CREATININE", "CALCIUM", "MG", "PHOS" in the last 72 hours. Liver Function Tests: No results for input(s): "AST", "ALT", "ALKPHOS", "BILITOT", "PROT", "ALBUMIN" in the last 72 hours. No results for input(s): "LIPASE", "AMYLASE" in the last 72 hours. CBC: No results for input(s): "WBC", "NEUTROABS", "HGB", "HCT", "MCV", "PLT" in the last 72 hours. Cardiac Enzymes: No results for input(s): "CKTOTAL", "CKMB", "CKMBINDEX", "TROPONINIHS" in the last 72 hours. BNP: No results for input(s): "BNP" in the last 72 hours. D-Dimer: No results for input(s): "DDIMER" in the last 72 hours. Hemoglobin A1C: No results for input(s): "HGBA1C" in the last 72 hours. Fasting Lipid Panel: No results for input(s): "CHOL", "HDL", "LDLCALC", "TRIG", "CHOLHDL", "LDLDIRECT" in the last 72 hours. Thyroid Function Tests: No results for input(s): "TSH", "T4TOTAL", "T3FREE", "THYROIDAB" in the last 72 hours.  Invalid input(s):  "FREET3" Anemia Panel: No results for input(s): "VITAMINB12", "FOLATE", "FERRITIN", "TIBC", "IRON", "RETICCTPCT" in the last 72 hours.   Radiology: No results found.   TELEMETRY reviewed by me [facility]) 03/30/2023 : normal sinus rhythm, rate in high 70s  EKG reviewed by me: normal sinus rhythm, no evidence of ischemia.  Data reviewed by me [facility]) 03/30/2023: last 24h vitals tele labs imaging I/O provider notes  Active Problems:   * No active hospital problems. *    ASSESSMENT AND PLAN:   Post op chest pain Do not recommend trending troponins as they will likely be slightly elevated due to post op state. EKG reviewed by me shows NSR without evidence of ischemia. Telemetry shows NSR with rates in 70s, no dynamic ST or T wave changes. Pain is likely due to  gas. Patient can follow up with me in office on Monday or sometime early next week.   Signed: Clotilde Dieter, DO  03/30/2023, 1:52 PM [facility] Cardiology

## 2023-03-30 NOTE — Interval H&P Note (Signed)
History and Physical Interval Note:  03/30/2023 10:02 AM  Isaiah Gonzalez  has presented today for surgery, with the diagnosis of chronic calculous cholecystitis.  The various methods of treatment have been discussed with the patient and family. After consideration of risks, benefits and other options for treatment, the patient has consented to  Procedure(s): XI ROBOTIC ASSISTED LAPAROSCOPIC CHOLECYSTECTOMY (N/A) INDOCYANINE GREEN FLUORESCENCE IMAGING (ICG) (N/A) as a surgical intervention.  The patient's history has been reviewed, patient examined, no change in status, stable for surgery.  I have reviewed the patient's chart and labs.  Questions were answered to the patient's satisfaction.     Campbell Lerner

## 2023-03-30 NOTE — Anesthesia Procedure Notes (Signed)
Procedure Name: Intubation Date/Time: 03/30/2023 11:27 AM  Performed by: Maryla Morrow., CRNAPre-anesthesia Checklist: Patient identified, Patient being monitored, Timeout performed, Emergency Drugs available and Suction available Patient Re-evaluated:Patient Re-evaluated prior to induction Oxygen Delivery Method: Circle system utilized Preoxygenation: Pre-oxygenation with 100% oxygen Induction Type: IV induction Ventilation: Mask ventilation without difficulty Laryngoscope Size: McGrath and 4 Grade View: Grade I Tube type: Oral Tube size: 7.5 mm Number of attempts: 1 Airway Equipment and Method: Stylet Placement Confirmation: ETT inserted through vocal cords under direct vision, positive ETCO2 and breath sounds checked- equal and bilateral Secured at: 21 cm Tube secured with: Tape Dental Injury: Teeth and Oropharynx as per pre-operative assessment

## 2023-03-30 NOTE — Anesthesia Preprocedure Evaluation (Signed)
Anesthesia Evaluation  Patient identified by MRN, date of birth, ID band Patient awake    Reviewed: Allergy & Precautions, NPO status , Patient's Chart, lab work & pertinent test results  Airway Mallampati: III  TM Distance: >3 FB Neck ROM: full    Dental  (+) Chipped, Dental Advidsory Given   Pulmonary neg pulmonary ROS   Pulmonary exam normal        Cardiovascular negative cardio ROS Normal cardiovascular exam     Neuro/Psych  PSYCHIATRIC DISORDERS Anxiety     negative neurological ROS     GI/Hepatic Neg liver ROS,GERD  Medicated and Controlled,,  Endo/Other  negative endocrine ROS    Renal/GU      Musculoskeletal   Abdominal   Peds  Hematology negative hematology ROS (+)   Anesthesia Other Findings Past Medical History: No date: Anxiety with agitation 2013: Appendicitis 02/2023: Chronic cholecystitis with calculus No date: Enlarged liver 1990: Left inguinal hernia 03/2013: Lumbar transverse process fracture (HCC)     Comment:  right L1 - L2 s/p crush by tree 08/02/2012: MRSA (methicillin resistant Staphylococcus aureus)  infection     Comment:  left arm abscess (? spider bite) 03/2013: Multiple rib fractures     Comment:  right 5-11; s/p crush by tree No date: Sleep apnea     Comment:  inconsistant use of C-Pap 03/2013: Trauma     Comment:  crushed right side from falling tree  Past Surgical History: 04/09/2012: APPENDECTOMY 08/02/2012: INCISION AND DRAINAGE ABSCESS; Left     Comment:  arm (? spider bite) +MRSA 1990: INGUINAL HERNIA REPAIR; Left No date: KNEE ARTHROSCOPY; Bilateral     Comment:  1990's  BMI    Body Mass Index: 35.29 kg/m      Reproductive/Obstetrics negative OB ROS                             Anesthesia Physical Anesthesia Plan  ASA: 2  Anesthesia Plan: General ETT and General   Post-op Pain Management:    Induction: Intravenous  PONV Risk  Score and Plan: 2 and Ondansetron, Dexamethasone and Midazolam  Airway Management Planned: Oral ETT  Additional Equipment:   Intra-op Plan:   Post-operative Plan: Extubation in OR  Informed Consent: I have reviewed the patients History and Physical, chart, labs and discussed the procedure including the risks, benefits and alternatives for the proposed anesthesia with the patient or authorized representative who has indicated his/her understanding and acceptance.     Dental Advisory Given  Plan Discussed with: Anesthesiologist, CRNA and Surgeon  Anesthesia Plan Comments: (Patient consented for risks of anesthesia including but not limited to:  - adverse reactions to medications - damage to eyes, teeth, lips or other oral mucosa - nerve damage due to positioning  - sore throat or hoarseness - Damage to heart, brain, nerves, lungs, other parts of body or loss of life  Patient voiced understanding and assent.)       Anesthesia Quick Evaluation

## 2023-03-30 NOTE — Transfer of Care (Signed)
Immediate Anesthesia Transfer of Care Note  Patient: Isaiah Gonzalez  Procedure(s) Performed: XI ROBOTIC ASSISTED LAPAROSCOPIC CHOLECYSTECTOMY INDOCYANINE GREEN FLUORESCENCE IMAGING (ICG)  Patient Location: PACU  Anesthesia Type:General  Level of Consciousness: drowsy and patient cooperative  Airway & Oxygen Therapy: Patient Spontanous Breathing and Patient connected to face mask oxygen  Post-op Assessment: Report given to RN and Post -op Vital signs reviewed and stable  Post vital signs: stable  Last Vitals:  Vitals Value Taken Time  BP 137/91 03/30/23 1304  Temp    Pulse 82 03/30/23 1309  Resp 19 03/30/23 1309  SpO2 90 % 03/30/23 1309  Vitals shown include unfiled device data.  Last Pain:  Vitals:   03/30/23 0843  TempSrc: Tympanic  PainSc: 5          Complications: No notable events documented.

## 2023-03-30 NOTE — Anesthesia Postprocedure Evaluation (Signed)
Anesthesia Post Note  Patient: Isaiah Gonzalez  Procedure(s) Performed: XI ROBOTIC ASSISTED LAPAROSCOPIC CHOLECYSTECTOMY INDOCYANINE GREEN FLUORESCENCE IMAGING (ICG)  Patient location during evaluation: PACU Anesthesia Type: General Level of consciousness: awake and alert Pain management: pain level controlled Vital Signs Assessment: post-procedure vital signs reviewed and stable Respiratory status: spontaneous breathing, nonlabored ventilation, respiratory function stable and patient connected to nasal cannula oxygen Cardiovascular status: blood pressure returned to baseline and stable Postop Assessment: no apparent nausea or vomiting Anesthetic complications: no Comments: Patient stated he had chest pain that was a 10 out of 10 in the PACU. Patient stated he felt like something was sitting on his chest. Immediately went to the patient's bedside where he stated he had right sided chest pain. Saturations never fell below 95. Oxygen was placed. 12 lead EKG was ordered and examined. No ST elevations were seen. Cardiology was consulted which stated that the patient's chest pain was not due to cardiac reasons. Explained to the patient that the most likely cause was the gas that was used during his procedure. Stated that he should return to an ED if he had any signs of worsening chest pain, shortness of breath, pain radiating to his arm or neck. Patient stated he understood and agreed.   There were no known notable events for this encounter.   Last Vitals:  Vitals:   03/30/23 1410 03/30/23 1415  BP:  115/72  Pulse: 78 80  Resp: 14 16  Temp:    SpO2: 97% 98%    Last Pain:  Vitals:   03/30/23 1415  TempSrc:   PainSc: 10-Worst pain ever                 Stephanie Coup

## 2023-03-30 NOTE — Op Note (Signed)
Robotic cholecystectomy with Indocyamine Green Ductal Imaging.   Pre-operative Diagnosis: Chronic calculus cholecystitis  Post-operative Diagnosis:  Same.  Procedure: Robotic assisted laparoscopic cholecystectomy with Indocyamine Green Ductal Imaging.   Surgeon: Campbell Lerner, M.D., FACS  Anesthesia: General. with endotracheal tube  Estimated Blood Loss: 15 mL         Drains: None         Specimens: Gallbladder           Complications: none  Procedure Details  The patient was seen again in the Holding Room.  1.25 mg dose of ICG was administered intravenously.   The benefits, complications, treatment options, risks and expected outcomes were again reviewed with the patient. The likelihood of improving the patient's symptoms with return to their baseline status is good.  The patient and/or family concurred with the proposed plan, giving informed consent, again alternatives reviewed.  The patient was taken to Operating Room, identified, and the procedure verified as robotic assisted laparoscopic cholecystectomy.  Prior to the induction of general anesthesia, antibiotic prophylaxis was administered. VTE prophylaxis was in place. General endotracheal anesthesia was then administered and tolerated well. The patient was positioned in the supine position.  After the induction, the abdomen was prepped with Chloraprep and draped in the sterile fashion.  A Time Out was held and the above information confirmed.  Just below the costal margin at Palmer's point, approximately midclavicular line the Veres needle is passed with sensation of the layers to penetrate the abdominal wall and into the peritoneum.  Saline drop test is confirmed peritoneal placement.  Insufflation is initiated with carbon dioxide to pressures of 15 mmHg.  Local infiltration with 0.25% Marcaine with epinephrine is utilized for all skin incisions.  Made a 12 mm incision on the right periumbilical site, I advanced an optical 11mm  port under direct visualization into the peritoneal cavity.  Once the peritoneum was penetrated, insufflation was initiated.  The trocar was then advanced into the abdominal cavity under direct visualization. Pneumoperitoneum was then continued utilizing CO2 at 15 mmHg or less and tolerated well without any adverse changes in the patient's vital signs.  Two 8.5-mm ports were placed in the left lower quadrant and laterally, and one to the right lower quadrant, all under direct vision. Local infiltration with a mixture of Exparel and 0.25% Marcaine with epinephrine is utilized for all port sites with deep infiltration under visualization.   The patient was positioned  in reverse Trendelenburg, tilted the patient's left side down.  Da Vinci XI robot was then positioned on to the patient's left side, and docked.  The gallbladder was identified, the fundus grasped via the arm 4 Prograsp and retracted cephalad. Adhesions were lysed with scissors and cautery, and blunt dissection with the suction irrigator.  The infundibulum was identified grasped and retracted laterally, exposing the peritoneum overlying the triangle of Calot. This was then opened and dissected using cautery & scissors.  We did achieve the critical view of safety with 2 and only 2 structures entering the gallbladder and we were able to see the liver plate posterior to this from both sides;with the extended critical view of the cystic duct and cystic artery obtained, aided by the ICG via FireFly which improved localization of the major ductal anatomy.    The cystic duct was clearly identified and dissected to isolation.   Artery well isolated and clipped, and the cystic duct was triple clipped and divided with scissors, as close to the gallbladder neck as feasible, thus  leaving two on the remaining stump.  The specimen side of the artery is sealed with bipolar and divided with monopolar scissors.   The gallbladder was taken from the gallbladder  fossa in a retrograde fashion with the electrocautery. The gallbladder was removed and placed in an Endocatch bag.  The liver bed is inspected. Hemostasis was confirmed.  The robot was undocked and moved away from the operative field. NSS irrigation was utilized and was aspirated clear.  The gallbladder and Endocatch sac were then removed through the paraumbilical port site.   Inspection of the right upper quadrant was performed. No bleeding, bile duct injury or leak, or bowel injury was noted. The infra-umbilical port site fascia was closed with interrumpted 0 Vicryl sutures using PMI/cone under direct visualization. Pneumoperitoneum was released and ports removed.  4-0 subcuticular Monocryl was used to close the skin. Dermabond was  applied.  The patient was then extubated and brought to the recovery room in stable condition. Sponge, lap, and needle counts were correct at closure and at the conclusion of the case.               Campbell Lerner, M.D., [facility] 03/30/2023 1:08 PM

## 2023-04-03 LAB — SURGICAL PATHOLOGY

## 2023-04-04 ENCOUNTER — Encounter: Payer: Self-pay | Admitting: Surgery

## 2023-04-07 ENCOUNTER — Other Ambulatory Visit: Payer: Self-pay | Admitting: Family Medicine

## 2023-04-07 DIAGNOSIS — F419 Anxiety disorder, unspecified: Secondary | ICD-10-CM

## 2023-04-09 NOTE — Telephone Encounter (Signed)
Requested Prescriptions  Pending Prescriptions Disp Refills   PARoxetine (PAXIL-CR) 12.5 MG 24 hr tablet [Pharmacy Med Name: PAROXETINE ER 12.5 MG TABLET] 30 tablet 23    Sig: TAKE 1 TABLET BY MOUTH EVERY DAY     Psychiatry:  Antidepressants - SSRI Passed - 04/09/2023 10:34 AM      Passed - Valid encounter within last 6 months    Recent Outpatient Visits           1 month ago Healthcare maintenance   [facility] Primary Care & Sports Medicine at [facility], Ocie Bob, MD   10 months ago Thoracolumbar back pain   [facility] Primary Care & Sports Medicine at [facility], Ocie Bob, MD   1 year ago Annual physical exam   [facility] Primary Care & Sports Medicine at [facility], Ocie Bob, MD   1 year ago Anxiety   [facility] Primary Care & Sports Medicine at [facility], Ocie Bob, MD       Future Appointments             In 10 months Ashley Royalty, Ocie Bob, MD [facility] Health Primary Care & Sports Medicine at [facility], [facility]

## 2023-04-11 NOTE — Progress Notes (Unsigned)
[facility] POST-OP OFFICE VISIT  04/12/2023  HPI: Isaiah Gonzalez is a 47 y.o. male 13 days s/p robotic cholecystectomy.  Still indicates he has a pain in the right infra incisional area of about 6 out of 10 at times.  Ices it at times.  Denies nausea, vomiting, fevers or chills.  His bowel movements have been regular.  He cannot eat lettuce.  Has a fairly physical job and cannot do any heavy lifting without exacerbating the incisional pain.  Vital signs: BP 126/85   Pulse 75   Temp 98.1 F (36.7 C) (Oral)   Ht 5\' 11"  (1.803 m)   Wt 242 lb 12.8 oz (110.1 kg)   SpO2 96%   BMI 33.86 kg/m    Physical Exam: Constitutional: He appears well. Abdomen: Tender around the right sided abdominal wall incisions.  Seemingly worse about the periumbilical incision where the extraction site was.  No evidence of mass, no change in appearance or presence of mass with lifting his head off the table.  No erythema or induration of any of the incision sites. Skin: All his incisions are intact with Dermabond, without evidence of ecchymosis, induration or erythema.  Assessment/Plan: This is a 47 y.o. male 13 days s/p robotic cholecystectomy.  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  We will keep him off work through January 20, and  will be released to full duty at that time.  Can follow-up with [place] as needed.   Campbell Lerner M.D., FACS 04/12/2023, 10:55 AM

## 2023-04-12 ENCOUNTER — Encounter: Payer: Self-pay | Admitting: Surgery

## 2023-04-12 ENCOUNTER — Ambulatory Visit (INDEPENDENT_AMBULATORY_CARE_PROVIDER_SITE_OTHER): Payer: 59 | Admitting: Surgery

## 2023-04-12 VITALS — BP 126/85 | HR 75 | Temp 98.1°F | Ht 71.0 in | Wt 242.8 lb

## 2023-04-12 DIAGNOSIS — Z09 Encounter for follow-up examination after completed treatment for conditions other than malignant neoplasm: Secondary | ICD-10-CM

## 2023-04-12 DIAGNOSIS — K8010 Calculus of gallbladder with chronic cholecystitis without obstruction: Secondary | ICD-10-CM

## 2023-04-12 DIAGNOSIS — Z9049 Acquired absence of other specified parts of digestive tract: Secondary | ICD-10-CM | POA: Insufficient documentation

## 2023-04-12 NOTE — Patient Instructions (Signed)

## 2023-04-17 ENCOUNTER — Ambulatory Visit: Payer: 59

## 2023-04-21 ENCOUNTER — Encounter: Payer: Self-pay | Admitting: Surgery

## 2023-04-26 ENCOUNTER — Telehealth: Payer: Self-pay | Admitting: *Deleted

## 2023-04-26 NOTE — Telephone Encounter (Signed)
 Faxed Updated FMLA to [employer] [phone]

## 2023-05-01 ENCOUNTER — Ambulatory Visit (INDEPENDENT_AMBULATORY_CARE_PROVIDER_SITE_OTHER): Payer: 59

## 2023-05-01 DIAGNOSIS — Z23 Encounter for immunization: Secondary | ICD-10-CM

## 2023-05-01 NOTE — Progress Notes (Signed)
   Established Patient Office Visit  Subjective   Patient ID: Isaiah Gonzalez, male    DOB: [birth year]  Age: 48 y.o. MRN: [id number]  Chief Complaint  Patient presents with   Immunizations    HPI    ROS    Objective:     There were no vitals taken for this visit.   Physical Exam   No results found for any visits on 05/01/23.    The 10-year ASCVD risk score (Arnett DK, et al., 2019) is: 2.2%    Assessment & Plan:   Problem List Items Addressed This Visit   None   No follow-ups on file.     Per orders of Dr. Jinny, injection 1 of 3 Hep A/B given by Jodie Headings.  Patient tolerated injection well.

## 2023-05-31 ENCOUNTER — Ambulatory Visit: Payer: 59 | Admitting: Gastroenterology

## 2023-05-31 DIAGNOSIS — Z23 Encounter for immunization: Secondary | ICD-10-CM | POA: Diagnosis not present

## 2023-06-04 NOTE — Progress Notes (Signed)
Vaccine visit 

## 2023-06-05 ENCOUNTER — Other Ambulatory Visit: Payer: Self-pay | Admitting: Family Medicine

## 2023-06-05 DIAGNOSIS — F419 Anxiety disorder, unspecified: Secondary | ICD-10-CM

## 2023-06-06 NOTE — Telephone Encounter (Signed)
Request too soon for refill. Last refill 04/09/23.  Requested Prescriptions  Pending Prescriptions Disp Refills   PARoxetine (PAXIL-CR) 12.5 MG 24 hr tablet [Pharmacy Med Name: PAROXETINE ER 12.5 MG TABLET] 90 tablet 4    Sig: TAKE 1 TABLET BY MOUTH EVERY DAY     Psychiatry:  Antidepressants - SSRI Passed - 06/06/2023 12:00 PM      Passed - Valid encounter within last 6 months    Recent Outpatient Visits           3 months ago Healthcare maintenance   [facility] Health Primary Care & Sports Medicine at [facility], Ocie Bob, MD   1 year ago Thoracolumbar back pain   [place] Primary Care & Sports Medicine at [facility], Ocie Bob, MD   1 year ago Annual physical exam   [facility] Primary Care & Sports Medicine at [facility], Ocie Bob, MD   1 year ago Anxiety   [facility] Primary Care & Sports Medicine at [facility], Ocie Bob, MD       Future Appointments             In 8 months Ashley Royalty, Ocie Bob, MD [facility] Health Primary Care & Sports Medicine at [facility], [facility]

## 2023-06-14 ENCOUNTER — Other Ambulatory Visit: Payer: Self-pay | Admitting: Family Medicine

## 2023-06-14 DIAGNOSIS — F419 Anxiety disorder, unspecified: Secondary | ICD-10-CM

## 2023-06-14 NOTE — Telephone Encounter (Signed)
 Requested medication (s) are due for refill today: -  Requested medication (s) are on the active medication list: hx. med  Last refill:  03/14/23   Future visit scheduled: yes  Notes to clinic:  hx. provider   Requested Prescriptions  Pending Prescriptions Disp Refills   pantoprazole (PROTONIX) 40 MG tablet [Pharmacy Med Name: PANTOPRAZOLE SOD DR 40 MG TAB] 90 tablet     Sig: TAKE 1 TABLET BY MOUTH EVERY DAY     Gastroenterology: Proton Pump Inhibitors Passed - 06/14/2023  2:36 PM      Passed - Valid encounter within last 12 months    Recent Outpatient Visits           4 months ago Healthcare maintenance   [facility] Primary Care & Sports Medicine at [facility], Ocie Bob, MD   1 year ago Thoracolumbar back pain   [place] Primary Care & Sports Medicine at [facility], Ocie Bob, MD   1 year ago Annual physical exam   [facility] Primary Care & Sports Medicine at [facility], Ocie Bob, MD   1 year ago Anxiety   [place] Primary Care & Sports Medicine at [facility], Ocie Bob, MD       Future Appointments             In 8 months Ashley Royalty, Ocie Bob, MD [facility] Health Primary Care & Sports Medicine at [facility], [facility]            Signed Prescriptions Disp Refills   buPROPion (WELLBUTRIN XL) 300 MG 24 hr tablet 90 tablet 2    Sig: TAKE 1 TABLET BY MOUTH EVERY DAY     Psychiatry: Antidepressants - bupropion Failed - 06/14/2023  2:36 PM      Failed - ALT in normal range and within 360 days    ALT  Date Value Ref Range Status  03/08/2023 49 (H) 0 - 44 IU/L Final   SGPT (ALT)  Date Value Ref Range Status  08/01/2012 45 12 - 78 U/L Final         Passed - Cr in normal range and within 360 days    Creatinine  Date Value Ref Range Status  08/02/2012 1.07 0.60 - 1.30 mg/dL Final   Creatinine, Ser  Date Value Ref Range Status  02/12/2023 1.07 0.76 - 1.27 mg/dL Final         Passed - AST in normal range and  within 360 days    AST  Date Value Ref Range Status  03/08/2023 38 0 - 40 IU/L Final   SGOT(AST)  Date Value Ref Range Status  08/01/2012 20 15 - 37 Unit/L Final         Passed - Last BP in normal range    BP Readings from Last 1 Encounters:  04/12/23 126/85         Passed - Valid encounter within last 6 months    Recent Outpatient Visits           4 months ago Healthcare maintenance   [facility] Primary Care & Sports Medicine at [facility], Ocie Bob, MD   1 year ago Thoracolumbar back pain   [place] Primary Care & Sports Medicine at [facility], Ocie Bob, MD   1 year ago Annual physical exam   [facility] Primary Care & Sports Medicine at [facility], Ocie Bob, MD   1 year ago Anxiety   [facility] Primary Care & [facility] Medicine at [facility], Ocie Bob, MD       Future Appointments             In 8 months Ashley Royalty, Ocie Bob, MD [facility] Primary Care & Sports Medicine at [facility], [facility]

## 2023-06-14 NOTE — Telephone Encounter (Signed)
 Bupropron:patient provider 1 year supply with last RF- will refill through next appointment  Requested Prescriptions  Pending Prescriptions Disp Refills   buPROPion (WELLBUTRIN XL) 300 MG 24 hr tablet [Pharmacy Med Name: BUPROPION HCL XL 300 MG TABLET] 90 tablet 2    Sig: TAKE 1 TABLET BY MOUTH EVERY DAY     Psychiatry: Antidepressants - bupropion Failed - 06/14/2023  2:35 PM      Failed - ALT in normal range and within 360 days    ALT  Date Value Ref Range Status  03/08/2023 49 (H) 0 - 44 IU/L Final   SGPT (ALT)  Date Value Ref Range Status  08/01/2012 45 12 - 78 U/L Final         Passed - Cr in normal range and within 360 days    Creatinine  Date Value Ref Range Status  08/02/2012 1.07 0.60 - 1.30 mg/dL Final   Creatinine, Ser  Date Value Ref Range Status  02/12/2023 1.07 0.76 - 1.27 mg/dL Final         Passed - AST in normal range and within 360 days    AST  Date Value Ref Range Status  03/08/2023 38 0 - 40 IU/L Final   SGOT(AST)  Date Value Ref Range Status  08/01/2012 20 15 - 37 Unit/L Final         Passed - Last BP in normal range    BP Readings from Last 1 Encounters:  04/12/23 126/85         Passed - Valid encounter within last 6 months    Recent Outpatient Visits           4 months ago Healthcare maintenance   [facility] Primary Care & Sports Medicine at [facility], Ocie Bob, MD   1 year ago Thoracolumbar back pain   [place] Primary Care & Sports Medicine at [facility], Ocie Bob, MD   1 year ago Annual physical exam   [facility] Primary Care & Sports Medicine at [facility], Ocie Bob, MD   1 year ago Anxiety   [facility] Primary Care & Sports Medicine at [facility], Ocie Bob, MD       Future Appointments             In 8 months Ashley Royalty, Ocie Bob, MD [facility] Health Primary Care & Sports Medicine at [facility], [facility]             pantoprazole (PROTONIX) 40 MG tablet [Pharmacy Med  Name: PANTOPRAZOLE SOD DR 40 MG TAB] 90 tablet     Sig: TAKE 1 TABLET BY MOUTH EVERY DAY     Gastroenterology: Proton Pump Inhibitors Passed - 06/14/2023  2:35 PM      Passed - Valid encounter within last 12 months    Recent Outpatient Visits           4 months ago Healthcare maintenance   [facility] Health Primary Care & Sports Medicine at [facility], Ocie Bob, MD   1 year ago Thoracolumbar back pain    Primary Care & Sports Medicine at [facility], Ocie Bob, MD   1 year ago Annual physical exam   [facility] Primary Care & Sports Medicine at [facility], Ocie Bob, MD   1 year ago Anxiety   [facility] Health Primary Care & Sports Medicine at [facility], Ocie Bob, MD       Future Appointments  In 8 months Ashley Royalty, Ocie Bob, MD [facility] Health Primary Care & Sports Medicine at [facility] [place], [facility]

## 2023-06-14 NOTE — Telephone Encounter (Signed)
 Please review.  KP

## 2023-06-21 ENCOUNTER — Encounter: Payer: Self-pay | Admitting: Family Medicine

## 2023-06-22 NOTE — Telephone Encounter (Signed)
 Please review.  KP

## 2023-09-09 ENCOUNTER — Other Ambulatory Visit: Payer: Self-pay | Admitting: Family Medicine

## 2023-09-09 DIAGNOSIS — K219 Gastro-esophageal reflux disease without esophagitis: Secondary | ICD-10-CM

## 2023-09-11 NOTE — Telephone Encounter (Signed)
 No longer listed on current medication list  Requested Prescriptions  Pending Prescriptions Disp Refills   lansoprazole  (PREVACID ) 30 MG capsule [Pharmacy Med Name: LANSOPRAZOLE  DR 30 MG CAPSULE] 90 capsule 3    Sig: TAKE 1 CAPSULE (30 MG TOTAL) BY MOUTH DAILY AT 12 NOON.     Gastroenterology: Proton Pump Inhibitors 2 Failed - 09/11/2023  2:58 PM      Failed - ALT in normal range and within 360 days    ALT  Date Value Ref Range Status  03/08/2023 49 (H) 0 - 44 IU/L Final   SGPT (ALT)  Date Value Ref Range Status  08/01/2012 45 12 - 78 U/L Final         Failed - Valid encounter within last 12 months    Recent Outpatient Visits   None     Future Appointments             In 5 months Isaiah Gonzalez, Isaiah Flow, MD [facility] Primary Care & Sports Medicine at [facility], [facility]            Passed - AST in normal range and within 360 days    AST  Date Value Ref Range Status  03/08/2023 38 0 - 40 IU/L Final   SGOT(AST)  Date Value Ref Range Status  08/01/2012 20 15 - 37 Unit/L Final

## 2023-10-03 ENCOUNTER — Telehealth: Payer: Self-pay

## 2023-10-03 ENCOUNTER — Telehealth: Payer: Self-pay | Admitting: Family Medicine

## 2023-10-03 ENCOUNTER — Other Ambulatory Visit: Payer: Self-pay

## 2023-10-03 DIAGNOSIS — Z1211 Encounter for screening for malignant neoplasm of colon: Secondary | ICD-10-CM

## 2023-10-03 NOTE — Telephone Encounter (Signed)
 Pt has requested to schedule his routine colonoscopy.  Previous office visit notes reviewed  from visit with Dr. Ole Berkeley.  Colonoscopy recommendation not noted.  Pt has been advised to have PCP send referral to office for routine screening colonoscopy and once referral has been received he will be contacted to schedule his colonoscopy with Dr. Ole Berkeley.  Pt verbalized understanding and plans on contacting his PCP.  Thanks  [place], [place]

## 2023-10-03 NOTE — Telephone Encounter (Signed)
 Copied from CRM [id number]. Topic: Referral - Request for Referral >> Oct 03, 2023  2:03 PM Rosamond Comes wrote: Did the patient discuss referral with their provider in the last year? Yes   Appointment offered? No  Type of order/referral and detailed reason for visit: Colonoscopy   Preference of office, provider, location: Dr Ole Berkeley  If referral order, have you been seen by this specialty before? Yes (If Yes, this issue or another issue? When? Where?  Can we respond through MyChart? Yes

## 2023-10-03 NOTE — Telephone Encounter (Signed)
 Completed. JM

## 2023-10-03 NOTE — Progress Notes (Signed)
 Colonoscopy referral ordered.   JM

## 2023-10-05 ENCOUNTER — Other Ambulatory Visit: Payer: Self-pay

## 2023-10-05 ENCOUNTER — Telehealth: Payer: Self-pay

## 2023-10-05 DIAGNOSIS — Z1211 Encounter for screening for malignant neoplasm of colon: Secondary | ICD-10-CM

## 2023-10-05 MED ORDER — NA SULFATE-K SULFATE-MG SULF 17.5-3.13-1.6 GM/177ML PO SOLN: 1.0000 | Freq: Once | ORAL | 0 refills | Status: AC

## 2023-10-05 NOTE — Telephone Encounter (Signed)
 Gastroenterology Pre-Procedure Review  Request Date: 10/23/23 Requesting Physician: Dr. Ole Berkeley  PATIENT REVIEW QUESTIONS: The patient responded to the following health history questions as indicated:    1. Are you having any GI issues? no 2. Do you have a personal history of Polyps? no 3. Do you have a family history of Colon Cancer or Polyps? no 4. Diabetes Mellitus? no 5. Joint replacements in the past 12 months?no 6. Major health problems in the past 3 months?no 7. Any artificial heart valves, MVP, or defibrillator?no    MEDICATIONS & ALLERGIES:    Patient reports the following regarding taking any anticoagulation/antiplatelet therapy:   Plavix, Coumadin, Eliquis, Xarelto, Lovenox, Pradaxa, Brilinta, or Effient? no Aspirin? no  Patient confirms/reports the following medications:  Current Outpatient Medications  Medication Sig Dispense Refill   albuterol (VENTOLIN HFA) 108 (90 Base) MCG/ACT inhaler Inhale 2 puffs into the lungs.     lansoprazole  (PREVACID ) 30 MG capsule Take 30 mg by mouth daily.     Na Sulfate-K Sulfate-Mg Sulfate concentrate (SUPREP) 17.5-3.13-1.6 GM/177ML SOLN Take 1 kit (354 mLs total) by mouth once for 1 dose. 354 mL 0   ondansetron  (ZOFRAN -ODT) 4 MG disintegrating tablet Take 4 mg by mouth every 8 (eight) hours as needed.     buPROPion  (WELLBUTRIN  XL) 300 MG 24 hr tablet TAKE 1 TABLET BY MOUTH EVERY DAY 90 tablet 2   Cyanocobalamin (B-12 PO) Take 1,000 mcg by mouth at bedtime.     ibuprofen  (ADVIL ) 800 MG tablet Take 1 tablet (800 mg total) by mouth every 8 (eight) hours as needed. 30 tablet 0   pantoprazole  (PROTONIX ) 40 MG tablet TAKE 1 TABLET BY MOUTH EVERY DAY 90 tablet 3   PARoxetine  (PAXIL -CR) 12.5 MG 24 hr tablet TAKE 1 TABLET BY MOUTH EVERY DAY 90 tablet 3   TURMERIC-GINGER PO Take 2 capsules by mouth at bedtime.     No current facility-administered medications for this visit.    Patient confirms/reports the following allergies:  Allergies   Allergen Reactions   Acetaminophen  Other (See Comments)    Liver complications Can't take due to having fatty liver    No orders of the defined types were placed in this encounter.   AUTHORIZATION INFORMATION Primary Insurance: 1D#: Group #:  Secondary Insurance: 1D#: Group #:  SCHEDULE INFORMATION: Date: 10/23/23 Time: Location: [facility]

## 2023-10-22 ENCOUNTER — Encounter: Payer: Self-pay | Admitting: Gastroenterology

## 2023-10-23 ENCOUNTER — Ambulatory Visit: Payer: 59

## 2023-10-23 ENCOUNTER — Encounter
Admission: RE | Disposition: A | Discharge status: Home / Self Care | Payer: Self-pay | Source: Home / Self Care | Attending: Gastroenterology

## 2023-10-23 ENCOUNTER — Encounter: Payer: Self-pay | Admitting: Gastroenterology

## 2023-10-23 ENCOUNTER — Other Ambulatory Visit: Payer: Self-pay

## 2023-10-23 ENCOUNTER — Ambulatory Visit
Admission: RE | Admit: 2023-10-23 | Discharge: 2023-10-23 | Disposition: A | Discharge status: Home / Self Care | Attending: Gastroenterology | Admitting: Gastroenterology

## 2023-10-23 ENCOUNTER — Ambulatory Visit: Admitting: Certified Registered"

## 2023-10-23 DIAGNOSIS — K5730 Diverticulosis of large intestine without perforation or abscess without bleeding: Secondary | ICD-10-CM | POA: Insufficient documentation

## 2023-10-23 DIAGNOSIS — K640 First degree hemorrhoids: Secondary | ICD-10-CM | POA: Insufficient documentation

## 2023-10-23 DIAGNOSIS — K635 Polyp of colon: Secondary | ICD-10-CM

## 2023-10-23 DIAGNOSIS — Z1211 Encounter for screening for malignant neoplasm of colon: Secondary | ICD-10-CM | POA: Diagnosis present

## 2023-10-23 DIAGNOSIS — D122 Benign neoplasm of ascending colon: Secondary | ICD-10-CM | POA: Insufficient documentation

## 2023-10-23 DIAGNOSIS — G4730 Sleep apnea, unspecified: Secondary | ICD-10-CM | POA: Insufficient documentation

## 2023-10-23 HISTORY — DX: Diverticulosis of intestine, part unspecified, without perforation or abscess without bleeding: K57.90

## 2023-10-23 HISTORY — PX: POLYPECTOMY: SHX149

## 2023-10-23 HISTORY — PX: COLONOSCOPY: SHX5424

## 2023-10-23 SURGERY — COLONOSCOPY
Anesthesia: General

## 2023-10-23 MED ORDER — PROPOFOL 10 MG/ML IV BOLUS
INTRAVENOUS | Status: AC
  Filled 2023-10-23: qty 40

## 2023-10-23 MED ORDER — LIDOCAINE HCL (PF) 2 % IJ SOLN
INTRAMUSCULAR | Status: AC
  Filled 2023-10-23: qty 5

## 2023-10-23 MED ORDER — SODIUM CHLORIDE 0.9 % IV SOLN: INTRAVENOUS | Status: DC

## 2023-10-23 MED ORDER — PROPOFOL 10 MG/ML IV BOLUS
INTRAVENOUS | Status: DC | PRN
  Administered 2023-10-23: 100 mg via INTRAVENOUS
  Administered 2023-10-23: 110 ug/kg/min via INTRAVENOUS

## 2023-10-23 MED ORDER — LIDOCAINE HCL (PF) 2 % IJ SOLN
INTRAMUSCULAR | Status: DC | PRN
  Administered 2023-10-23: 100 mg via INTRADERMAL

## 2023-10-23 NOTE — Anesthesia Postprocedure Evaluation (Signed)
 Anesthesia Post Note  Patient: Isaiah Gonzalez  Procedure(s) Performed: COLONOSCOPY POLYPECTOMY, INTESTINE  Patient location during evaluation: Endoscopy Anesthesia Type: General Level of consciousness: awake and alert Pain management: pain level controlled Vital Signs Assessment: post-procedure vital signs reviewed and stable Respiratory status: spontaneous breathing, nonlabored ventilation, respiratory function stable and patient connected to nasal cannula oxygen Cardiovascular status: blood pressure returned to baseline and stable Postop Assessment: no apparent nausea or vomiting Anesthetic complications: no   No notable events documented.   Last Vitals:  Vitals:   10/23/23 1032 10/23/23 1042  BP: 109/71 (!) 126/94  Pulse: 71   Resp: 18 18  Temp:    SpO2: 94%     Last Pain:  Vitals:   10/23/23 1042  TempSrc:   PainSc: 0-No pain                 Isaiah Gonzalez

## 2023-10-23 NOTE — H&P (Signed)
 Isaiah Copping, MD [facility] [address] [place], [place] [address] Phone: [phone] Fax : [phone]  Primary Care Physician:  Isaiah Selinda PARAS, MD Primary Gastroenterologist:  Dr. Copping  Pre-Procedure History & Physical: HPI:  Isaiah Gonzalez is a 48 y.o. male is here for a screening colonoscopy.   Past Medical History:  Diagnosis Date   Anxiety with agitation    Appendicitis 2013   CCC (chronic calculous cholecystitis) 02/15/2023   Chronic cholecystitis with calculus 02/2023   Diverticulosis    Enlarged liver    Left inguinal hernia 1990   Lumbar transverse process fracture (HCC) 03/2013   right L1 - L2 s/p crush by tree   MRSA (methicillin resistant Staphylococcus aureus) infection 08/02/2012   left arm abscess (? spider bite)   Multiple rib fractures 03/2013   right 5-11; s/p crush by tree   Sleep apnea    inconsistant use of C-Pap   Trauma 03/2013   crushed right side from falling tree    Past Surgical History:  Procedure Laterality Date   APPENDECTOMY  04/09/2012   CHOLECYSTECTOMY     INCISION AND DRAINAGE ABSCESS Left 08/02/2012   arm (? spider bite) +MRSA   INGUINAL HERNIA REPAIR Left 1990   KNEE ARTHROSCOPY Bilateral    1990's    Prior to Admission medications   Medication Sig Start Date End Date Taking? Authorizing Provider  ibuprofen  (ADVIL ) 800 MG tablet Take 1 tablet (800 mg total) by mouth every 8 (eight) hours as needed. 03/30/23  Yes Isaiah Shope, MD  ondansetron  (ZOFRAN -ODT) 4 MG disintegrating tablet Take 4 mg by mouth every 8 (eight) hours as needed. 05/14/23  Yes [provider]  TURMERIC-GINGER PO Take 2 capsules by mouth at bedtime.   Yes [provider]  albuterol (VENTOLIN HFA) 108 (90 Base) MCG/ACT inhaler Inhale 2 puffs into the lungs. Patient not taking: Reported on 10/23/2023 07/18/23 07/17/24  [provider]  buPROPion  (WELLBUTRIN  XL) 300 MG 24 hr tablet TAKE 1 TABLET BY MOUTH EVERY DAY 06/14/23    Matthews, Jason J, MD  Cyanocobalamin (B-12 PO) Take 1,000 mcg by mouth at bedtime.    [provider]  lansoprazole  (PREVACID ) 30 MG capsule Take 30 mg by mouth daily. 06/06/23   [provider]  pantoprazole  (PROTONIX ) 40 MG tablet TAKE 1 TABLET BY MOUTH EVERY DAY 06/22/23   Isaiah Selinda PARAS, MD  PARoxetine  (PAXIL -CR) 12.5 MG 24 hr tablet TAKE 1 TABLET BY MOUTH EVERY DAY 04/09/23   Isaiah Selinda PARAS, MD    Allergies as of 10/05/2023 - Review Complete 04/12/2023  Allergen Reaction Noted   Acetaminophen  Other (See Comments) 05/02/2015    Family History  Problem Relation Age of Onset   Heart attack Father    Heart attack Maternal Grandfather     Social History   Socioeconomic History   Marital status: Married    Spouse name: Brandi   Number of children: 1   Years of education: Not on file   Highest education level: Not on file  Occupational History   Not on file  Tobacco Use   Smoking status: Never   Smokeless tobacco: Never  Vaping Use   Vaping status: Never Used  Substance and Sexual Activity   Alcohol use: No   Drug use: No   Sexual activity: Yes  Other Topics Concern   Not on file  Social History Narrative   Not on file   Social Drivers of Health   Financial  Resource Strain: Low Risk  (05/31/2022)   Overall Financial Resource Strain (CARDIA)    Difficulty of Paying Living Expenses: Not hard at all  Food Insecurity: No Food Insecurity (05/31/2022)   Hunger Vital Sign    Worried About Running Out of Food in the Last Year: Never true    Ran Out of Food in the Last Year: Never true  Transportation Needs: No Transportation Needs (05/31/2022)   PRAPARE - Administrator, Civil Service (Medical): No    Lack of Transportation (Non-Medical): No  Physical Activity: Not on file  Stress: Not on file  Social Connections: Not on file  Intimate Partner Violence: Not At Risk (05/31/2022)   Humiliation, Afraid, Rape, and Kick questionnaire    Fear of  Current or Ex-Partner: No    Emotionally Abused: No    Physically Abused: No    Sexually Abused: No    Review of Systems: See HPI, otherwise negative ROS  Physical Exam: BP (!) 147/81   Pulse 67   Temp (!) 95.6 F (35.3 C) (Temporal)   Resp 18   Ht 5' 11 (1.803 m)   Wt 108.9 kg   SpO2 97%   BMI 33.47 kg/m  General:   Alert,  pleasant and cooperative in NAD Head:  Normocephalic and atraumatic. Neck:  Supple; no masses or thyromegaly. Lungs:  Clear throughout to auscultation.    Heart:  Regular rate and rhythm. Abdomen:  Soft, nontender and nondistended. Normal bowel sounds, without guarding, and without rebound.   Neurologic:  Alert and  oriented x4;  grossly normal neurologically.  Impression/Plan: Isaiah Gonzalez is now here to undergo a screening colonoscopy.  Risks, benefits, and alternatives regarding colonoscopy have been reviewed with the patient.  Questions have been answered.  All parties agreeable.

## 2023-10-23 NOTE — Op Note (Signed)
 [facility] Gastroenterology Patient Name: Isaiah Gonzalez Procedure Date: 10/23/2023 9:51 AM MRN: [id number] Account #: [id number] Date of Birth: 16-Jan-1976 Admit Type: Outpatient Age: 48 Room: [facility] ENDO ROOM 4 Gender: Male Note Status: Finalized Instrument Name: Colonoscope [id number] Procedure:             Colonoscopy Indications:           Screening for colorectal malignant neoplasm Providers:             Rogelia Copping MD, MD Referring MD:          Rogelia Copping MD, MD (Referring MD), Selinda DOROTHA Ku                         (Referring MD) Medicines:             Propofol  per Anesthesia Complications:         No immediate complications. Procedure:             Pre-Anesthesia Assessment:                        - Prior to the procedure, a History and Physical was                         performed, and patient medications and allergies were                         reviewed. The patient's tolerance of previous                         anesthesia was also reviewed. The risks and benefits                         of the procedure and the sedation options and risks                         were discussed with the patient. All questions were                         answered, and informed consent was obtained. Prior                         Anticoagulants: The patient has taken no anticoagulant                         or antiplatelet agents. ASA Grade Assessment: II - A                         patient with mild systemic disease. After reviewing                         the risks and benefits, the patient was deemed in                         satisfactory condition to undergo the procedure.                        After obtaining informed consent, the colonoscope was  passed under direct vision. Throughout the procedure,                         the patient's blood pressure, pulse, and oxygen                         saturations were monitored continuously. The                          Colonoscope was introduced through the anus and                         advanced to the the cecum, identified by appendiceal                         orifice and ileocecal valve. The colonoscopy was                         performed without difficulty. The patient tolerated                         the procedure well. The quality of the bowel                         preparation was good. Findings:      The perianal and digital rectal examinations were normal.      A 4 mm polyp was found in the ascending colon. The polyp was sessile.       The polyp was removed with a cold snare. Resection and retrieval were       complete.      Multiple small-mouthed diverticula were found in the entire colon.      Non-bleeding internal hemorrhoids were found during retroflexion. The       hemorrhoids were Grade I (internal hemorrhoids that do not prolapse). Impression:            - One 4 mm polyp in the ascending colon, removed with                         a cold snare. Resected and retrieved.                        - Diverticulosis in the entire examined colon.                        - Non-bleeding internal hemorrhoids. Recommendation:        - Discharge patient to home.                        - Resume previous diet.                        - Continue present medications.                        - Await pathology results.                        - If the pathology report reveals adenomatous tissue,  then repeat the colonoscopy for surveillance in 7                         years. Procedure Code(s):     --- Professional ---                        [phone], Colonoscopy, flexible; with removal of                         tumor(s), polyp(s), or other lesion(s) by snare                         technique Diagnosis Code(s):     --- Professional ---                        Z12.11, Encounter for screening for malignant neoplasm                         of colon                         D12.2, Benign neoplasm of ascending colon CPT copyright 2022 American Medical Association. All rights reserved. The codes documented in this report are preliminary and upon coder review may  be revised to meet current compliance requirements. Rogelia Copping MD, MD 10/23/2023 10:20:38 AM This report has been signed electronically. Number of Addenda: 0 Note Initiated On: 10/23/2023 9:51 AM Scope Withdrawal Time: 0 hours 8 minutes 5 seconds  Total Procedure Duration: 0 hours 14 minutes 49 seconds  Estimated Blood Loss:  Estimated blood loss: none.      [facility]

## 2023-10-23 NOTE — Anesthesia Preprocedure Evaluation (Addendum)
 Anesthesia Evaluation  Patient identified by MRN, date of birth, ID band Patient awake    Reviewed: Allergy & Precautions, NPO status , Patient's Chart, lab work & pertinent test results  History of Anesthesia Complications Negative for: history of anesthetic complications  Airway Mallampati: III  TM Distance: >3 FB Neck ROM: full    Dental no notable dental hx.    Pulmonary sleep apnea    Pulmonary exam normal        Cardiovascular negative cardio ROS Normal cardiovascular exam     Neuro/Psych  PSYCHIATRIC DISORDERS Anxiety      Neuromuscular disease    GI/Hepatic negative GI ROS, Neg liver ROS,,,  Endo/Other  negative endocrine ROS    Renal/GU negative Renal ROS  negative genitourinary   Musculoskeletal   Abdominal   Peds  Hematology negative hematology ROS (+)   Anesthesia Other Findings Past Medical History: No date: Anxiety with agitation 2013: Appendicitis 02/15/2023: CCC (chronic calculous cholecystitis) 02/2023: Chronic cholecystitis with calculus No date: Diverticulosis No date: Enlarged liver 1990: Left inguinal hernia 03/2013: Lumbar transverse process fracture (HCC)     Comment:  right L1 - L2 s/p crush by tree 08/02/2012: MRSA (methicillin resistant Staphylococcus aureus)  infection     Comment:  left arm abscess (? spider bite) 03/2013: Multiple rib fractures     Comment:  right 5-11; s/p crush by tree No date: Sleep apnea     Comment:  inconsistant use of C-Pap 03/2013: Trauma     Comment:  crushed right side from falling tree  Past Surgical History: 04/09/2012: APPENDECTOMY No date: CHOLECYSTECTOMY 08/02/2012: INCISION AND DRAINAGE ABSCESS; Left     Comment:  arm (? spider bite) +MRSA 1990: INGUINAL HERNIA REPAIR; Left No date: KNEE ARTHROSCOPY; Bilateral     Comment:  1990's     Reproductive/Obstetrics negative OB ROS                              Anesthesia Physical Anesthesia Plan  ASA: 2  Anesthesia Plan: General   Post-op Pain Management: Minimal or no pain anticipated   Induction: Intravenous  PONV Risk Score and Plan: 1  Airway Management Planned: Natural Airway and Nasal Cannula  Additional Equipment:   Intra-op Plan:   Post-operative Plan:   Informed Consent: I have reviewed the patients History and Physical, chart, labs and discussed the procedure including the risks, benefits and alternatives for the proposed anesthesia with the patient or authorized representative who has indicated his/her understanding and acceptance.     Dental Advisory Given  Plan Discussed with: Anesthesiologist, CRNA and Surgeon  Anesthesia Plan Comments: (Patient consented for risks of anesthesia including but not limited to:  - adverse reactions to medications - risk of airway placement if required - damage to eyes, teeth, lips or other oral mucosa - nerve damage due to positioning  - sore throat or hoarseness - Damage to heart, brain, nerves, lungs, other parts of body or loss of life  Patient voiced understanding and assent.)       Anesthesia Quick Evaluation

## 2023-10-23 NOTE — Transfer of Care (Signed)
 Immediate Anesthesia Transfer of Care Note  Patient: Isaiah Gonzalez  Procedure(s) Performed: COLONOSCOPY POLYPECTOMY, INTESTINE  Patient Location: Endoscopy Unit  Anesthesia Type:General  Level of Consciousness: drowsy  Airway & Oxygen Therapy: Patient Spontanous Breathing  Post-op Assessment: Report given to RN and Post -op Vital signs reviewed and stable  Post vital signs: Reviewed and stable  Last Vitals:  Vitals Value Taken Time  BP 102/68 1022  Temp 35.9 1022  Pulse 76 10/23/23 10:22  Resp 18 10/23/23 10:22  SpO2 95 % 10/23/23 10:22  Vitals shown include unfiled device data.  Last Pain:  Vitals:   10/23/23 0909  TempSrc: Temporal  PainSc: 0-No pain         Complications: No notable events documented.

## 2023-10-24 LAB — SURGICAL PATHOLOGY

## 2023-10-25 ENCOUNTER — Ambulatory Visit (INDEPENDENT_AMBULATORY_CARE_PROVIDER_SITE_OTHER)

## 2023-10-25 ENCOUNTER — Ambulatory Visit: Payer: Self-pay | Admitting: Gastroenterology

## 2023-10-25 DIAGNOSIS — Z23 Encounter for immunization: Secondary | ICD-10-CM

## 2023-10-29 NOTE — Progress Notes (Signed)
 Patient ID: Isaiah Gonzalez, male   DOB: [birth year], 48 y.o.   MRN: [id number]   Vaccine

## 2023-10-29 NOTE — Addendum Note (Signed)
 Addended by: LANNIE ANDREA GRADE on: 10/29/2023 01:34 PM   Modules accepted: Orders

## 2023-10-29 NOTE — Addendum Note (Signed)
 Addended by: LANNIE ANDREA GRADE on: 10/29/2023 01:37 PM   Modules accepted: Level of Service

## 2023-10-29 NOTE — Progress Notes (Signed)
 Per orders of Dr. Jinny, injection 3 of  3 Twinrix given by Jodie Headings In Left deltoid   Patient tolerated injection well.

## 2024-01-21 ENCOUNTER — Ambulatory Visit: Payer: Self-pay

## 2024-01-21 NOTE — Telephone Encounter (Signed)
Noted  Pt going to ER.  KP

## 2024-01-21 NOTE — Telephone Encounter (Signed)
 FYI Only or Action Required?: FYI only for provider.  Patient was last seen in primary care on 02/12/2023 by Alvia Selinda PARAS, MD.  Called Nurse Triage reporting Chest Pain.  Symptoms began x 3 days ago.  Interventions attempted: Nothing.  Symptoms are: unchanged.  Triage Disposition: Go to ED Now (or PCP Triage)  Patient/caregiver understands and will follow disposition?: Yes  **referred to the ED**           Copied from CRM #[id number]. Topic: Clinical - Red Word Triage >> Jan 21, 2024  8:24 AM Donna BRAVO wrote: Red Word that prompted transfer to Nurse Triage: patient feeling funny, vomited when showering last night, a funny feeling in chest when standing doing nothing, when laying down not moving and staying still the feeling isn't, headache on left side of head,  arms going numb when driving or mowing lawn Reason for Disposition  Patient sounds very sick or weak to the triager  Answer Assessment - Initial Assessment Questions 1. LOCATION: Where does it hurt?        He states its a funny feeling  2. RADIATION: Does the pain go anywhere else? (e.g., into neck, jaw, arms, back)     No   3. ONSET: When did the chest pain begin? (Minutes, hours or days)      X 3 days   4. PATTERN: Does the pain come and go, or has it been constant since it started?  Does it get worse with exertion?      He denies pain, but states the funny feeling is constant, until he lays down   5. DURATION: How long does it last (e.g., seconds, minutes, hours)     Hours, all day, until he lays down   6. SEVERITY: How bad is the pain?  (e.g., Scale 1-10; mild, moderate, or severe)     No pain   7. CARDIAC RISK FACTORS: Do you have any history of heart problems or risk factors for heart disease? (e.g., angina, prior heart attack; diabetes, high blood pressure, high cholesterol, smoker, or strong family history of heart disease)     No   8. PULMONARY RISK FACTORS: Do you have any  history of lung disease?  (e.g., blood clots in lung, asthma, emphysema, birth control pills)     No   9. CAUSE: What do you think is causing the chest pain?     Unknown   10. OTHER SYMPTOMS: Do you have any other symptoms? (e.g., dizziness, nausea, vomiting, sweating, fever, difficulty breathing, cough)  Vomiting, arms numbness, fast breathing, no other acute symptoms noted. SOB on exertion. Patient referred to the ED for symptoms; he agrees with plan of care.  Protocols used: Chest Pain-A-AH

## 2024-01-22 ENCOUNTER — Telehealth: Payer: Self-pay | Admitting: Family Medicine

## 2024-01-22 NOTE — Telephone Encounter (Signed)
 Copied from CRM #[id number]. Topic: General - Other >> Jan 22, 2024 12:52 PM Isaiah Gonzalez wrote: Reason for CRM: Pt called stated [facility] stated liver inflamed and need labs in 3 weeks.Pt is scheduled for physical on 02/14/2024.

## 2024-02-14 ENCOUNTER — Encounter: Payer: Self-pay | Admitting: Family Medicine

## 2024-02-14 ENCOUNTER — Ambulatory Visit: Payer: Self-pay | Admitting: Family Medicine

## 2024-02-14 VITALS — BP 108/68 | HR 76 | Temp 97.9°F | Ht 71.0 in | Wt 252.8 lb

## 2024-02-14 DIAGNOSIS — Z23 Encounter for immunization: Secondary | ICD-10-CM | POA: Diagnosis not present

## 2024-02-14 DIAGNOSIS — M545 Low back pain, unspecified: Secondary | ICD-10-CM

## 2024-02-14 DIAGNOSIS — E66812 Obesity, class 2: Secondary | ICD-10-CM | POA: Diagnosis not present

## 2024-02-14 DIAGNOSIS — M2241 Chondromalacia patellae, right knee: Secondary | ICD-10-CM

## 2024-02-14 DIAGNOSIS — E662 Morbid (severe) obesity with alveolar hypoventilation: Secondary | ICD-10-CM

## 2024-02-14 DIAGNOSIS — K760 Fatty (change of) liver, not elsewhere classified: Secondary | ICD-10-CM | POA: Diagnosis not present

## 2024-02-14 DIAGNOSIS — Z0000 Encounter for general adult medical examination without abnormal findings: Secondary | ICD-10-CM | POA: Diagnosis not present

## 2024-02-14 DIAGNOSIS — Z6835 Body mass index (BMI) 35.0-35.9, adult: Secondary | ICD-10-CM

## 2024-02-14 DIAGNOSIS — M546 Pain in thoracic spine: Secondary | ICD-10-CM

## 2024-02-14 DIAGNOSIS — G4733 Obstructive sleep apnea (adult) (pediatric): Secondary | ICD-10-CM

## 2024-02-14 DIAGNOSIS — M2242 Chondromalacia patellae, left knee: Secondary | ICD-10-CM

## 2024-02-14 MED ORDER — TIRZEPATIDE-WEIGHT MANAGEMENT 2.5 MG/0.5ML ~~LOC~~ SOLN: 2.5000 mg | SUBCUTANEOUS | 0 refills | Status: DC

## 2024-02-14 NOTE — Assessment & Plan Note (Signed)
 Hepatic inflammation - Recent hospitalization for liver inflammation - Denies alcohol consumption - Elevated ALT levels - Cholesterol levels within normal limits - Surprised by abnormal liver findings   Fibrosis 4 Score = 1.25 (Low risk)        Interpretation for patients with NAFLD          <1.30       -  F0-F1 (Low risk)          1.30-2.67 -  Indeterminate           >2.67      -  F3-F4 (High risk)     Validated for ages 78-65      Score is based on prior labs.  Obesity with nonalcoholic fatty liver disease Obesity with NAFLD with recent liver inflammation and elevated ALT levels. Interested in weight loss medications for appetite control and weight management. - Order liver ultrasound with elastography to assess liver elasticity and confirm fatty liver disease. - Once confirmed, prescribe Wegovy for weight loss and fatty liver management, pending insurance approval. Discussed side effects including nausea and need for dietary adjustments to avoid gastrointestinal discomfort. - Provide list of over-the-counter options to maintain regular bowel movements. - Encourage sustainable dietary changes and regular physical activity to support weight loss and liver health.

## 2024-02-14 NOTE — Progress Notes (Signed)
 Annual Physical Exam Visit  Patient Information:  Patient ID: Isaiah Gonzalez, male DOB: 1976-01-19 Age: 48 y.o. MRN: [id number]   Subjective:   CC: Annual Physical Exam  HPI:  Isaiah Gonzalez is here for their annual physical.  I reviewed the past medical history, family history, social history, surgical history, and allergies today and changes were made as necessary.  Please see the problem list section below for additional details.  Past Medical History: Past Medical History:  Diagnosis Date   Anxiety with agitation    Appendicitis 2013   CCC (chronic calculous cholecystitis) 02/15/2023   Chronic cholecystitis with calculus 02/2023   Diverticulosis    Enlarged liver    Left inguinal hernia 1990   Lumbar transverse process fracture (HCC) 03/2013   right L1 - L2 s/p crush by tree   MRSA (methicillin resistant Staphylococcus aureus) infection 08/02/2012   left arm abscess (? spider bite)   Multiple rib fractures 03/2013   right 5-11; s/p crush by tree   Sleep apnea    inconsistant use of C-Pap   Trauma 03/2013   crushed right side from falling tree   Past Surgical History: Past Surgical History:  Procedure Laterality Date   APPENDECTOMY  04/09/2012   CHOLECYSTECTOMY     COLONOSCOPY N/A 10/23/2023   Procedure: COLONOSCOPY;  Surgeon: Jinny Carmine, MD;  Location: [facility] ENDOSCOPY;  Service: Endoscopy;  Laterality: N/A;   INCISION AND DRAINAGE ABSCESS Left 08/02/2012   arm (? spider bite) +MRSA   INGUINAL HERNIA REPAIR Left 1990   KNEE ARTHROSCOPY Bilateral    1990's   POLYPECTOMY  10/23/2023   Procedure: POLYPECTOMY, INTESTINE;  Surgeon: Jinny Carmine, MD;  Location: [facility] ENDOSCOPY;  Service: Endoscopy;;   Family History: Family History  Problem Relation Age of Onset   Heart attack Father    Heart attack Maternal Grandfather    Allergies: Allergies  Allergen Reactions   Acetaminophen  Other (See Comments)    Liver complications Can't take due to  having fatty liver   Health Maintenance: Health Maintenance  Topic Date Due   COVID-19 Vaccine (3 - 2025-26 season) 03/01/2024 (Originally 12/24/2023)   DTaP/Tdap/Td (3 - Td or Tdap) 04/24/2030   Colonoscopy  10/23/2030   Influenza Vaccine  Completed   Hepatitis B Vaccines 19-59 Average Risk  Completed   Hepatitis C Screening  Completed   HIV Screening  Completed   Pneumococcal Vaccine  Aged Out   HPV VACCINES  Aged Out   Meningococcal B Vaccine  Aged Out    HM Colonoscopy          Upcoming     Colonoscopy (Every 7 Years) Next due on 10/23/2030    10/23/2023  COLONOSCOPY   Only the first 1 history entries have been loaded, but more history exists.               Medications: Current Outpatient Medications on File Prior to Visit  Medication Sig Dispense Refill   buPROPion  (WELLBUTRIN  XL) 300 MG 24 hr tablet TAKE 1 TABLET BY MOUTH EVERY DAY 90 tablet 2   Cyanocobalamin (B-12 PO) Take 1,000 mcg by mouth at bedtime.     ibuprofen  (ADVIL ) 800 MG tablet Take 1 tablet (800 mg total) by mouth every 8 (eight) hours as needed. 30 tablet 0   lansoprazole  (PREVACID ) 30 MG capsule Take 30 mg by mouth daily.     ondansetron  (ZOFRAN -ODT) 4 MG disintegrating tablet Take 4 mg by mouth every 8 (eight)  hours as needed.     pantoprazole  (PROTONIX ) 40 MG tablet TAKE 1 TABLET BY MOUTH EVERY DAY 90 tablet 3   PARoxetine  (PAXIL -CR) 12.5 MG 24 hr tablet TAKE 1 TABLET BY MOUTH EVERY DAY 90 tablet 3   TURMERIC-GINGER PO Take 2 capsules by mouth at bedtime.     albuterol (VENTOLIN HFA) 108 (90 Base) MCG/ACT inhaler Inhale 2 puffs into the lungs. (Patient not taking: Reported on 10/23/2023)     No current facility-administered medications on file prior to visit.    Discussed the use of AI scribe software for clinical note transcription with the patient, who gave verbal consent to proceed.   Objective:   Vitals:   02/14/24 0807  BP: 108/68  Pulse: 76  Temp: 97.9 F (36.6 C)  SpO2: 97%    Vitals:   02/14/24 0807  Weight: 252 lb 12.8 oz (114.7 kg)  Height: 5' 11 (1.803 m)   Body mass index is 35.26 kg/m.  General: Well Developed, well nourished, and in no acute distress.  Neuro: Alert and oriented x3, extra-ocular muscles intact, sensation grossly intact. Cranial nerves II through XII are grossly intact, motor, sensory, and coordinative functions are intact. HEENT: Normocephalic, atraumatic, neck supple, no masses, no lymphadenopathy, thyroid  nonenlarged. Oropharynx, nasopharynx, external ear canals are unremarkable. Skin: Warm and dry, no rashes noted.  Cardiac: Regular rate and rhythm, no murmurs rubs or gallops. No peripheral edema. Pulses symmetric. Respiratory: Clear to auscultation bilaterally. Speaking in full sentences.  Abdominal: Soft, nontender, nondistended, positive bowel sounds, no masses, no organomegaly. Musculoskeletal: Stable, and with full range of motion.  Impression and Recommendations:   The patient was counselled, risk factors were discussed, and anticipatory guidance given.  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      Relevant Medications   tirzepatide (ZEPBOUND) 2.5 MG/0.5ML injection vial   Fatty liver   Hepatic inflammation - Recent hospitalization for liver inflammation - Denies alcohol consumption - Elevated ALT levels - Cholesterol levels within normal limits - Surprised by abnormal liver findings   Fibrosis 4 Score = 1.25 (Low risk)        Interpretation for patients with NAFLD          <1.30       -  F0-F1 (Low risk)          1.30-2.67 -  Indeterminate           >2.67      -  F3-F4 (High risk)     Validated for ages 55-65      Score is based on prior  labs.  Obesity with nonalcoholic fatty liver disease Obesity with NAFLD with recent liver inflammation and elevated ALT levels. Interested in weight loss medications for appetite control and weight management. - Order liver ultrasound with elastography to assess liver elasticity and confirm fatty liver disease. - Once confirmed, prescribe Wegovy for weight loss and fatty liver management, pending insurance approval. Discussed side effects including nausea and need for dietary adjustments to avoid gastrointestinal discomfort. - Provide list of over-the-counter options to maintain regular bowel movements. - Encourage sustainable dietary changes and regular physical activity to support weight loss and liver health.  Relevant Orders   US  ELASTOGRAPHY LIVER   Healthcare maintenance - Primary   Annual examination completed, risk stratification labs ordered, anticipatory guidance provided.  We will follow labs once resulted.      Relevant Orders   CBC   Comprehensive metabolic panel with GFR   Hemoglobin A1c   Lipid panel   PSA Total (Reflex To Free)   OSA on CPAP   Sleep apnea - Uses CPAP machine - Average use of 6 hours and 31 minutes per night over the last month  Obstructive sleep apnea Obstructive sleep apnea managed with CPAP therapy. Discussed Zepbound as an alternative to [facility] for weight loss and sleep apnea management. - Prescribe Zepbound for weight loss and sleep apnea management, pending insurance approval. - Continue CPAP compliance.      Relevant Medications   tirzepatide (ZEPBOUND) 2.5 MG/0.5ML injection vial   Thoracolumbar back pain   Lower extremity and hip pain - Right hip pain, especially after driving, attributed to low back - Pain severity requires pulling over while driving  Chronic right hip and thoracolumbar pain Chronic right hip and back pain attributed to lumbar etiology, describes radicular features, likely due to low back issues causing muscle  tension and nerve compression. - Send home exercise program to strengthen muscles and alleviate hip pain. - Encourage weight loss to reduce pressure on the spine and improve symptoms. - Advise to report if pain persists or worsens, as further interventions may be considered.      Other Visit Diagnoses       Encounter for immunization       Relevant Orders   Flu vaccine trivalent PF, 6mos and older(Flulaval,Afluria,Fluarix,Fluzone) (Completed)        Orders & Medications Medications:  Meds ordered this encounter  Medications   tirzepatide (ZEPBOUND) 2.5 MG/0.5ML injection vial    Sig: Inject 2.5 mg into the skin once a week.    Dispense:  2 mL    Refill:  0   Orders Placed This Encounter  Procedures   US  ELASTOGRAPHY LIVER   Flu vaccine trivalent PF, 6mos and older(Flulaval,Afluria,Fluarix,Fluzone)   CBC   Comprehensive metabolic panel with GFR   Hemoglobin A1c   Lipid panel   PSA Total (Reflex To Free)     No follow-ups on file.    Selinda JINNY Ku, MD, [facility]   Primary Care Sports Medicine Primary Care and Sports Medicine at [facility]

## 2024-02-14 NOTE — Assessment & Plan Note (Signed)
 Sleep apnea - Uses CPAP machine - Average use of 6 hours and 31 minutes per night over the last month  Obstructive sleep apnea Obstructive sleep apnea managed with CPAP therapy. Discussed Zepbound as an alternative to [facility] for weight loss and sleep apnea management. - Prescribe Zepbound for weight loss and sleep apnea management, pending insurance approval. - Continue CPAP compliance.

## 2024-02-14 NOTE — Assessment & Plan Note (Signed)
 Lower extremity and hip pain - Right hip pain, especially after driving, attributed to low back - Pain severity requires pulling over while driving  Chronic right hip and thoracolumbar pain Chronic right hip and back pain attributed to lumbar etiology, describes radicular features, likely due to low back issues causing muscle tension and nerve compression. - Send home exercise program to strengthen muscles and alleviate hip pain. - Encourage weight loss to reduce pressure on the spine and improve symptoms. - Advise to report if pain persists or worsens, as further interventions may be considered.

## 2024-02-14 NOTE — Assessment & Plan Note (Signed)
-   History of arthroscopic knee surgeries - Both knees currently asymptomatic

## 2024-02-14 NOTE — Assessment & Plan Note (Signed)
 See additional assessment(s) for plan details.

## 2024-02-14 NOTE — Patient Instructions (Addendum)
-   Obtain fasting labs with orders provided (can have water or black coffee but otherwise no food or drink x 8 hours before labs) - Review information provided - Attend eye doctor annually, dentist every 6 months, work towards or maintain 30 minutes of moderate intensity physical activity at least 5 days per week, and consume a balanced diet - Return in 1 year for physical - Contact us  for any questions between now and then   Patient Plan  Chondromalacia Patellae - No current knee symptoms. No specific action needed at this time.  Obesity with Nonalcoholic Fatty Liver Disease (NAFLD) - Liver ultrasound with elastography has been ordered to check liver health. - If fatty liver is confirmed, semaglutide [facility]) may be prescribed for weight loss and liver health, pending insurance approval. - Discussed possible side effects of semaglutide, including nausea and the need for dietary changes to reduce stomach upset. - Use over-the-counter options as needed to maintain regular bowel movements (list provided). - Focus on healthy eating and regular physical activity to support weight loss and liver health.  Obstructive Sleep Apnea - Continue using CPAP machine as directed. - Tirzepatide (Zepbound) may be prescribed for weight loss and sleep apnea management, pending insurance approval.  Chronic Right Hip and Thoracolumbar Back Pain - Follow the home exercise program provided to strengthen muscles and help with hip pain. - Work on weight loss to reduce pressure on the spine and improve pain. - Report if pain continues or gets worse, as further treatment may be needed.  Red flags - seek care right away if you notice: - New or worsening numbness, tingling, or weakness in your legs - Loss of bladder or bowel control - Severe or sudden increase in pain - Yellowing of the skin or eyes, severe abdominal pain, or confusion - Any new or concerning symptoms

## 2024-02-14 NOTE — Assessment & Plan Note (Signed)
 Annual examination completed, risk stratification labs ordered, anticipatory guidance provided.  We will follow labs once resulted.

## 2024-02-15 ENCOUNTER — Other Ambulatory Visit (HOSPITAL_COMMUNITY): Payer: Self-pay

## 2024-02-15 ENCOUNTER — Encounter: Payer: Self-pay | Admitting: Family Medicine

## 2024-02-15 ENCOUNTER — Telehealth: Payer: Self-pay | Admitting: Pharmacy Technician

## 2024-02-15 ENCOUNTER — Ambulatory Visit: Payer: Self-pay | Admitting: Family Medicine

## 2024-02-15 DIAGNOSIS — K7581 Nonalcoholic steatohepatitis (NASH): Secondary | ICD-10-CM

## 2024-02-15 LAB — LIPID PANEL
Chol/HDL Ratio: 4.3 ratio (ref 0.0–5.0)
Cholesterol, Total: 142 mg/dL (ref 100–199)
HDL: 33 mg/dL — ABNORMAL LOW (ref >39)
LDL Chol Calc (NIH): 93 mg/dL (ref 0–99)
Triglycerides: 82 mg/dL (ref 0–149)
VLDL Cholesterol Cal: 16 mg/dL (ref 5–40)

## 2024-02-15 LAB — CBC
Hematocrit: 50.3 % (ref 37.5–51.0)
Hemoglobin: 16.5 g/dL (ref 13.0–17.7)
MCH: 29.9 pg (ref 26.6–33.0)
MCHC: 32.8 g/dL (ref 31.5–35.7)
MCV: 91 fL (ref 79–97)
Platelets: 220 x10E3/uL (ref 150–450)
RBC: 5.52 x10E6/uL (ref 4.14–5.80)
RDW: 12.8 % (ref 11.6–15.4)
WBC: 6.2 x10E3/uL (ref 3.4–10.8)

## 2024-02-15 LAB — COMPREHENSIVE METABOLIC PANEL WITH GFR
ALT: 56 IU/L — ABNORMAL HIGH (ref 0–44)
AST: 43 IU/L — ABNORMAL HIGH (ref 0–40)
Albumin: 4.1 g/dL (ref 4.1–5.1)
Alkaline Phosphatase: 69 IU/L (ref 47–123)
BUN/Creatinine Ratio: 11 (ref 9–20)
BUN: 12 mg/dL (ref 6–24)
Bilirubin Total: 0.5 mg/dL (ref 0.0–1.2)
CO2: 24 mmol/L (ref 20–29)
Calcium: 9.4 mg/dL (ref 8.7–10.2)
Chloride: 102 mmol/L (ref 96–106)
Creatinine, Ser: 1.08 mg/dL (ref 0.76–1.27)
Globulin, Total: 2.9 g/dL (ref 1.5–4.5)
Glucose: 104 mg/dL — ABNORMAL HIGH (ref 70–99)
Potassium: 4.7 mmol/L (ref 3.5–5.2)
Sodium: 139 mmol/L (ref 134–144)
Total Protein: 7 g/dL (ref 6.0–8.5)
eGFR: 85 mL/min/1.73 (ref >59)

## 2024-02-15 LAB — PSA TOTAL (REFLEX TO FREE): Prostate Specific Ag, Serum: 0.8 ng/mL (ref 0.0–4.0)

## 2024-02-15 LAB — HEMOGLOBIN A1C
Est. average glucose Bld gHb Est-mCnc: 111 mg/dL
Hgb A1c MFr Bld: 5.5 % (ref 4.8–5.6)

## 2024-02-15 NOTE — Telephone Encounter (Signed)
 Please review. JM

## 2024-02-15 NOTE — Telephone Encounter (Signed)
 Please seek PA for Zepbound for OSA. Thank you.  Isaiah Gonzalez

## 2024-02-15 NOTE — Telephone Encounter (Signed)
 PA request has been Received. New Encounter has been or will be created for follow up. For additional info see Pharmacy Prior Auth telephone encounter from 02/15/24.

## 2024-02-15 NOTE — Telephone Encounter (Signed)
 Pharmacy Patient Advocate Encounter   Received notification from Patient Advice Request messages that prior authorization for Zepbound 2.5mg /0.59ml pen is required/requested.   Insurance verification completed.   The patient is insured through [facility].   Per test claim: Refill too soon. PA is not needed at this time. Medication was filled 02/14/24. Next eligible fill date is 03/06/24.  Zepbound went through his insurance, he may have a high copay due to high deductible or his plan may have just discounted the drug but it did go through his plan.

## 2024-02-15 NOTE — Telephone Encounter (Signed)
 Copied from CRM #[id number]. Topic: Clinical - Prescription Issue >> Feb 15, 2024 10:12 AM Isaiah Gonzalez wrote: Reason for CRM: Patient called saying His insurrance said they need a PA Trizwpatide 2.5 mg or Zepbound.  They need to know it is for his liver and not to loose weight.  [phone]

## 2024-02-18 ENCOUNTER — Ambulatory Visit: Admission: RE | Admit: 2024-02-18 | Source: Ambulatory Visit

## 2024-02-18 ENCOUNTER — Ambulatory Visit

## 2024-02-18 NOTE — Telephone Encounter (Signed)
 PT response.  JM

## 2024-02-19 ENCOUNTER — Ambulatory Visit
Admission: RE | Admit: 2024-02-19 | Discharge: 2024-02-19 | Disposition: A | Discharge status: Home / Self Care | Source: Ambulatory Visit | Attending: Family Medicine | Admitting: Family Medicine

## 2024-02-19 DIAGNOSIS — K760 Fatty (change of) liver, not elsewhere classified: Secondary | ICD-10-CM | POA: Insufficient documentation

## 2024-02-19 DIAGNOSIS — K7581 Nonalcoholic steatohepatitis (NASH): Secondary | ICD-10-CM | POA: Insufficient documentation

## 2024-02-19 MED ORDER — WEGOVY 0.25 MG/0.5ML ~~LOC~~ SOAJ: 0.2500 mg | SUBCUTANEOUS | 0 refills | Status: AC

## 2024-02-20 ENCOUNTER — Other Ambulatory Visit (HOSPITAL_COMMUNITY): Payer: Self-pay

## 2024-02-20 NOTE — Telephone Encounter (Signed)
 Please forward to our pharmacy team and ensure that they are looking at coverage for Zepbound his OSA diagnosis - not weight loss.  They can also look at coverage for [facility] for MASH diagnosis as well.  Thanks, JJM

## 2024-02-20 NOTE — Telephone Encounter (Signed)
 Please review Dr. Alvia message above. Thank you.  JM

## 2024-02-20 NOTE — Telephone Encounter (Signed)
 Please review and advise patient.   JM

## 2024-02-20 NOTE — Telephone Encounter (Signed)
 Good afternoon, I just got off the phone with Isaiah Gonzalez insurance and spoke with [name], again she confirmed his Zepbound do not need a PA for any reason, he is responsible for 20% of the cost of the medication until he reaches his out of pocket deductible which accordng to [facility] has not been met.  I hope this helps!

## 2024-02-21 NOTE — Telephone Encounter (Signed)
 Please review PA team message above. Thank you.  JM

## 2024-02-21 NOTE — Telephone Encounter (Signed)
 Please advise patient. JM

## 2024-03-03 NOTE — Telephone Encounter (Signed)
 Please review. JM

## 2024-03-05 NOTE — Telephone Encounter (Signed)
 Please review and advise.  JM

## 2024-03-07 NOTE — Telephone Encounter (Signed)
 FYI  KP

## 2024-03-15 ENCOUNTER — Other Ambulatory Visit: Payer: Self-pay | Admitting: Family Medicine

## 2024-03-15 DIAGNOSIS — F419 Anxiety disorder, unspecified: Secondary | ICD-10-CM

## 2024-03-17 NOTE — Telephone Encounter (Signed)
 Requested Prescriptions  Pending Prescriptions Disp Refills   buPROPion  (WELLBUTRIN  XL) 300 MG 24 hr tablet [Pharmacy Med Name: BUPROPION  HCL XL 300 MG TABLET] 90 tablet 2    Sig: TAKE 1 TABLET BY MOUTH EVERY DAY     Psychiatry: Antidepressants - bupropion  Failed - 03/17/2024  1:55 PM      Failed - AST in normal range and within 360 days    AST  Date Value Ref Range Status  02/14/2024 43 (H) 0 - 40 IU/L Final   SGOT(AST)  Date Value Ref Range Status  08/01/2012 20 15 - 37 Unit/L Final         Failed - ALT in normal range and within 360 days    ALT  Date Value Ref Range Status  02/14/2024 56 (H) 0 - 44 IU/L Final   SGPT (ALT)  Date Value Ref Range Status  08/01/2012 45 12 - 78 U/L Final         Passed - Cr in normal range and within 360 days    Creatinine  Date Value Ref Range Status  08/02/2012 1.07 0.60 - 1.30 mg/dL Final   Creatinine, Ser  Date Value Ref Range Status  02/14/2024 1.08 0.76 - 1.27 mg/dL Final         Passed - Last BP in normal range    BP Readings from Last 1 Encounters:  02/14/24 108/68         Passed - Valid encounter within last 6 months    Recent Outpatient Visits           1 month ago Healthcare maintenance   [facility] Primary Care & Sports Medicine at [facility], Jason J, MD

## 2024-04-09 ENCOUNTER — Other Ambulatory Visit: Payer: Self-pay | Admitting: Family Medicine

## 2024-04-09 DIAGNOSIS — F419 Anxiety disorder, unspecified: Secondary | ICD-10-CM

## 2024-04-11 NOTE — Telephone Encounter (Signed)
 Requested Prescriptions  Pending Prescriptions Disp Refills   PARoxetine  (PAXIL -CR) 12.5 MG 24 hr tablet [Pharmacy Med Name: PAROXETINE  ER 12.5 MG TABLET] 30 tablet 5    Sig: TAKE 1 TABLET BY MOUTH EVERY DAY     Psychiatry:  Antidepressants - SSRI Passed - 04/11/2024  1:45 PM      Passed - Valid encounter within last 6 months    Recent Outpatient Visits           1 month ago Healthcare maintenance   [facility] Primary Care & Sports Medicine at [facility], Selinda PARAS, MD

## 2025-02-16 ENCOUNTER — Encounter: Admitting: Family Medicine
